# Patient Record
Sex: Male | Born: 1980 | Race: White | Hispanic: No | Smoking: Former smoker
Health system: Southern US, Community
[De-identification: ages and names within clinical notes are randomized; demographics above are authoritative.]

## PROBLEM LIST (undated history)

## (undated) DIAGNOSIS — Z789 Other specified health status: Secondary | ICD-10-CM

## (undated) HISTORY — PX: KNEE SURGERY: SHX244

## (undated) HISTORY — PX: SHOULDER SURGERY: SHX246

---

## 2016-09-30 ENCOUNTER — Ambulatory Visit
Admission: EM | Admit: 2016-09-30 | Discharge: 2016-09-30 | Disposition: A | Discharge status: Home / Self Care | Payer: BLUE CROSS/BLUE SHIELD | Attending: Emergency Medicine | Admitting: Emergency Medicine

## 2016-09-30 ENCOUNTER — Encounter: Payer: Self-pay | Admitting: Emergency Medicine

## 2016-09-30 DIAGNOSIS — J020 Streptococcal pharyngitis: Secondary | ICD-10-CM

## 2016-09-30 LAB — RAPID STREP SCREEN (MED CTR MEBANE ONLY): STREPTOCOCCUS, GROUP A SCREEN (DIRECT): POSITIVE — AB

## 2016-09-30 MED ORDER — DEXAMETHASONE SODIUM PHOSPHATE 10 MG/ML IJ SOLN
10.0000 mg | Freq: Once | INTRAMUSCULAR | Status: AC
  Administered 2016-09-30: 10 mg via INTRAMUSCULAR

## 2016-09-30 MED ORDER — IBUPROFEN 600 MG PO TABS: 600.0000 mg | ORAL_TABLET | Freq: Four times a day (QID) | ORAL | 0 refills | Status: DC | PRN

## 2016-09-30 MED ORDER — PENICILLIN G BENZATHINE 1200000 UNIT/2ML IM SUSP
1.2000 10*6.[IU] | Freq: Once | INTRAMUSCULAR | Status: AC
  Administered 2016-09-30: 1.2 10*6.[IU] via INTRAMUSCULAR

## 2016-09-30 MED ORDER — ACETAMINOPHEN 500 MG PO TABS
1000.0000 mg | ORAL_TABLET | Freq: Once | ORAL | Status: AC
  Administered 2016-09-30: 1000 mg via ORAL

## 2016-09-30 MED ORDER — IBUPROFEN 800 MG PO TABS
800.0000 mg | ORAL_TABLET | Freq: Once | ORAL | Status: AC
  Administered 2016-09-30: 800 mg via ORAL

## 2016-09-30 NOTE — ED Notes (Signed)
Patient waited 15 minutes and no reactions were noted. Injection site is unremarkable. 

## 2016-09-30 NOTE — Discharge Instructions (Signed)
1 gram of Tylenol and 600 mg ibuprofen together 3-4 times a day as needed for pain.  Make sure you drink plenty of extra fluids.  Some people find salt water gargles and  Traditional Medicinal's "Throat Coat" tea helpful. Take 5 mL of liquid Benadryl and 5 mL of Maalox. Mix it together, and then hold it in your mouth for as long as you can and then swallow. You may do this 4 times a day.  O2 the ER for the signs and symptoms we discussed otherwise follow up with one of the primary care physicians below.  Here is a list of primary care providers who are taking new patients:  Dr. Elizabeth Sauereanna Jones [address] [place] [address] [phone]  Dr. Everlene OtherJayce Cook [address]  [address]  [place] [id number]  [phone]  [facility] [place] [address]  [place] [address]  [phone]   Go to [email or web address].com to look up your medications. This will give you a list of where you can find your prescriptions at the most affordable prices.

## 2016-09-30 NOTE — ED Provider Notes (Signed)
HPI  SUBJECTIVE:  Patient reports sore throat starting yesterday. pain is worse on the left side.. Sx worse with swallowing.  Sx better with nothing. Has tried salt water gargles, a shot of tequila and Mucinex.  No fever + Swollen neck glands    No Cough/URI sxs + Myalgias No Headache No Rash     No Recent Strep or mono Exposure No Abdominal Pain No reflux sxs No Allergy sxs  No Breathing difficulty, but states it is difficult to swallow because it "comes back up". He states that it is difficult to talk but has no muffled, hot potato voice. No Drooling No Trismus No antipyretic in past 4-6 hrs. Pt is a smoker. Past medical history negative for diabetes, hypertension, mono, strep PMD: None   History reviewed. No pertinent past medical history.  Past Surgical History:  Procedure Laterality Date  . KNEE SURGERY Bilateral   . SHOULDER SURGERY Left     History reviewed. No pertinent family history.  Social History  Substance Use Topics  . Smoking status: Current Every Day Smoker    Packs/day: 1.00    Types: Cigarettes  . Smokeless tobacco: Never Used  . Alcohol use Yes    No current facility-administered medications for this encounter.   Current Outpatient Prescriptions:  .  ibuprofen (ADVIL,MOTRIN) 600 MG tablet, Take 1 tablet (600 mg total) by mouth every 6 (six) hours as needed., Disp: 30 tablet, Rfl: 0  No Known Allergies   ROS  As noted in HPI.   Physical Exam  BP (!) 143/77 (BP Location: Right Arm)   Pulse 98   Temp (!) 100.5 F (38.1 C) (Oral)   Resp 16   Ht 5\' 10"  (1.778 m)   Wt 236 lb (107 kg)   SpO2 97%   BMI 33.86 kg/m   Constitutional: Well developed, well nourished, no acute distress Eyes:  EOMI, conjunctiva normal bilaterally HENT: Normocephalic, atraumatic,mucus membranes moist. - nasal congestion + erythematous oropharynx + enlarged tonsils +  exudates. Airway patent. Uvula midline, normal size. No drooling, trismus, stridor. No  muffled hot potato voice Respiratory: Normal inspiratory effort Cardiovascular: Normal rate, no murmurs, rubs, gallops GI: nondistended, nontender. No appreciable splenomegaly skin: No rash, skin intact Lymph: + cervical LN  Musculoskeletal: no deformities Neurologic: Alert & oriented x 3, no focal neuro deficits Psychiatric: Speech and behavior appropriate.  ED Course   Medications  dexamethasone (DECADRON) injection 10 mg (10 mg Intramuscular Given 09/30/16 1223)  ibuprofen (ADVIL,MOTRIN) tablet 800 mg (800 mg Oral Given 09/30/16 1226)  acetaminophen (TYLENOL) tablet 1,000 mg (1,000 mg Oral Given 09/30/16 1226)  penicillin g benzathine (BICILLIN LA) 1200000 UNIT/2ML injection 1.2 Million Units (1.2 Million Units Intramuscular Given 09/30/16 1224)    Orders Placed This Encounter  Procedures  . Rapid strep screen    Standing Status:   Standing    Number of Occurrences:   1    Results for orders placed or performed during the hospital encounter of 09/30/16 (from the past 24 hour(s))  Rapid strep screen     Status: Abnormal   Collection Time: 09/30/16 11:51 AM  Result Value Ref Range   Streptococcus, Group A Screen (Direct) POSITIVE (A) NEGATIVE   No results found.  ED Clinical Impression  Strep pharyngitis   ED Assessment/Plan  Rapid strep positive. Giving Bicillin 1.2 million units IM, Decadron 10 mg IM, ibuprofen 800 mg with 1 g of Tylenol. No evidence of airway compromise at this time.. Home with ibuprofen, Tylenol. Patient  to followup with PMD of choice when necessary, will refer to local primary care resources.  Discussed labs,  MDM, plan and followup with patient. Discussed sn/sx that should prompt return to the ED. Patient agrees with plan.   Meds ordered this encounter  Medications  . dexamethasone (DECADRON) injection 10 mg  . ibuprofen (ADVIL,MOTRIN) tablet 800 mg  . acetaminophen (TYLENOL) tablet 1,000 mg  . penicillin g benzathine (BICILLIN LA) 1200000  UNIT/2ML injection 1.2 Million Units    Order Specific Question:   Antibiotic Indication:    Answer:   Pharyngitis  . ibuprofen (ADVIL,MOTRIN) 600 MG tablet    Sig: Take 1 tablet (600 mg total) by mouth every 6 (six) hours as needed.    Dispense:  30 tablet    Refill:  0     *This clinic note was created using Scientist, clinical (histocompatibility and immunogenetics). Therefore, there may be occasional mistakes despite careful proofreading.    Domenick Gong, MD 09/30/16 1246

## 2016-09-30 NOTE — ED Triage Notes (Signed)
Patient c/o sore throat that started yesterday.  Patient denies fevers.  

## 2016-10-03 ENCOUNTER — Telehealth: Payer: Self-pay

## 2017-03-05 ENCOUNTER — Encounter: Payer: Self-pay | Admitting: *Deleted

## 2017-03-05 ENCOUNTER — Ambulatory Visit
Admission: EM | Admit: 2017-03-05 | Discharge: 2017-03-05 | Disposition: A | Discharge status: Home / Self Care | Payer: BLUE CROSS/BLUE SHIELD | Attending: Family Medicine | Admitting: Family Medicine

## 2017-03-05 DIAGNOSIS — S46911A Strain of unspecified muscle, fascia and tendon at shoulder and upper arm level, right arm, initial encounter: Secondary | ICD-10-CM

## 2017-03-05 MED ORDER — NAPROXEN 500 MG PO TABS: 500.0000 mg | ORAL_TABLET | Freq: Two times a day (BID) | ORAL | 0 refills | Status: DC

## 2017-03-05 NOTE — ED Provider Notes (Signed)
[facility]    CSN: [id number] Arrival date & time: 03/05/17  1946     History   Chief Complaint Chief Complaint  Patient presents with  . Shoulder Pain    HPI Mike Hansen is a 36 y.o. male.   HPI  Is a 36 year old male who presents with right shoulder pain. States he coworker were lifting a generator yesterday when he felt a pull and a pop in his shoulder. As the day progresses and into today the shoulder pain is gotten much worse. He states with movement of his arm away from his side the pain is worse and he also hears 3 distinct pops in the shoulder. He indicates that the shoulder pain is so localized over the acromioclavicular joint. He also had one episode where her pain would run down into his ring and little fingers. His neck is not involved.         History reviewed. No pertinent past medical history.  There are no active problems to display for this patient.   Past Surgical History:  Procedure Laterality Date  . KNEE SURGERY Bilateral   . SHOULDER SURGERY Left        Home Medications    Prior to Admission medications   Medication Sig Start Date End Date Taking? Authorizing Provider  ibuprofen (ADVIL,MOTRIN) 600 MG tablet Take 1 tablet (600 mg total) by mouth every 6 (six) hours as needed. 09/30/16  Yes Domenick Gong, MD  naproxen (NAPROSYN) 500 MG tablet Take 1 tablet (500 mg total) by mouth 2 (two) times daily with a meal. 03/05/17   Lutricia Feil, PA-C    Family History History reviewed. No pertinent family history.  Social History Social History  Substance Use Topics  . Smoking status: Current Every Day Smoker    Packs/day: 1.00    Types: Cigarettes  . Smokeless tobacco: Never Used  . Alcohol use Yes     Allergies   Patient has no known allergies.   Review of Systems Review of Systems  Constitutional: Positive for activity change. Negative for appetite change, chills, fatigue and fever.  Musculoskeletal:  Positive for arthralgias.  All other systems reviewed and are negative.    Physical Exam Triage Vital Signs ED Triage Vitals  Enc Vitals Group     BP 03/05/17 1956 (!) 150/88     Pulse Rate 03/05/17 1956 79     Resp 03/05/17 1956 16     Temp 03/05/17 1956 98.1 F (36.7 C)     Temp Source 03/05/17 1956 Oral     SpO2 03/05/17 1956 99 %     Weight 03/05/17 1953 239 lb (108.4 kg)     Height 03/05/17 1953 5\' 10"  (1.778 m)     Head Circumference --      Peak Flow --      Pain Score 03/05/17 1954 8     Pain Loc --      Pain Edu? --      Excl. in GC? --    No data found.   Updated Vital Signs BP (!) 150/88 (BP Location: Left Arm)   Pulse 79   Temp 98.1 F (36.7 C) (Oral)   Resp 16   Ht 5\' 10"  (1.778 m)   Wt 239 lb (108.4 kg)   SpO2 99%   BMI 34.29 kg/m   Visual Acuity Right Eye Distance:   Left Eye Distance:   Bilateral Distance:    Right Eye Near:   Left  Eye Near:    Bilateral Near:     Physical Exam  Constitutional: He is oriented to person, place, and time. He appears well-developed and well-nourished. No distress.  HENT:  Head: Normocephalic.  Eyes: Pupils are equal, round, and reactive to light. Right eye exhibits no discharge. Left eye exhibits no discharge.  Neck: Normal range of motion. Neck supple.  Musculoskeletal: He exhibits tenderness.  Examination of the right shoulder shows no deformity. Tenderness is Sharply localized over the acromioclavicular joint which causes him his discomfort. Passively the patient has good range of motion of his shoulder. He did have one small pop with initial abduction. This could not be reproduced a second time. Upper extremity sensation is intact to light touch. Upper extremity strength is intact. Range of motion is full and comfortable  Neurological: He is alert and oriented to person, place, and time.  Skin: Skin is warm and dry. He is not diaphoretic.  Psychiatric: He has a normal mood and affect. His behavior is normal.  Judgment and thought content normal.  Nursing note and vitals reviewed.    UC Treatments / Results  Labs (all labs ordered are listed, but only abnormal results are displayed) Labs Reviewed - No data to display  EKG  EKG Interpretation None       Radiology No results found.  Procedures Procedures (including critical care time)  Medications Ordered in UC Medications - No data to display   Initial Impression / Assessment and Plan / UC Course  I have reviewed the triage vital signs and the nursing notes.  Pertinent labs & imaging results that were available during my care of the patient were reviewed by me and considered in my medical decision making (see chart for details).     Plan: 1. Test/x-ray results and diagnosis reviewed with patient 2. rx as per orders; risks, benefits, potential side effects reviewed with patient 3. Recommend supportive treatment with symptom avoidance and rest. Recommend anti-inflammatory medications and ice 20 minutes out of every 2 hours 4-5 times daily. Biofreeze 3 times daily may help in lessening his pain. Is not improving he should follow-up with [facility] when he has established relationship  4. F/u prn if symptoms worsen or don't improve   Final Clinical Impressions(s) / UC Diagnoses   Final diagnoses:  Strain of AC joint, right, initial encounter    New Prescriptions Discharge Medication List as of 03/05/2017  8:16 PM    START taking these medications   Details  naproxen (NAPROSYN) 500 MG tablet Take 1 tablet (500 mg total) by mouth 2 (two) times daily with a meal., Starting Thu 03/05/2017, Print         Controlled Substance Prescriptions [place] Controlled Substance Registry consulted? Not Applicable   Lutricia Feil, PA-C 03/05/17 2022

## 2017-03-05 NOTE — ED Triage Notes (Signed)
Right arm pain . Pt was lifting generator yesterday and felt like something pulled on the shoulder.

## 2017-11-14 ENCOUNTER — Ambulatory Visit
Admission: EM | Admit: 2017-11-14 | Discharge: 2017-11-14 | Disposition: A | Discharge status: Home / Self Care | Payer: BLUE CROSS/BLUE SHIELD | Attending: Family Medicine | Admitting: Family Medicine

## 2017-11-14 DIAGNOSIS — F17210 Nicotine dependence, cigarettes, uncomplicated: Secondary | ICD-10-CM | POA: Diagnosis not present

## 2017-11-14 DIAGNOSIS — R0789 Other chest pain: Secondary | ICD-10-CM

## 2017-11-14 DIAGNOSIS — R1013 Epigastric pain: Secondary | ICD-10-CM | POA: Diagnosis not present

## 2017-11-14 DIAGNOSIS — R079 Chest pain, unspecified: Secondary | ICD-10-CM | POA: Diagnosis present

## 2017-11-14 MED ORDER — GI COCKTAIL ~~LOC~~
30.0000 mL | Freq: Once | ORAL | Status: AC
  Administered 2017-11-14: 30 mL via ORAL

## 2017-11-14 NOTE — Discharge Instructions (Signed)
Prilosec or Prevacid or Nexium (2 tablets daily) Take tylenol only for pain TUMS

## 2017-11-14 NOTE — ED Provider Notes (Signed)
[facility]    CSN: [id number] Arrival date & time: 11/14/17  1246     History   Chief Complaint Chief Complaint  Patient presents with  . Chest Pain    HPI Mike Hansen is a 37 y.o. male.   The history is provided by the patient.  Chest Pain  Pain location:  Substernal area and epigastric (lower sternum and epigastric) Pain quality: aching   Pain radiates to:  Does not radiate Pain severity:  Moderate Onset quality:  Sudden Duration:  2 days Timing:  Constant Progression:  Waxing and waning Chronicity:  New Context comment:  Has been taking ibuprofen and Goody Powder for the past month for right shoulder pain after rotator cuff surgery Relieved by:  Nothing Ineffective treatments:  Antacids (TUMS) Associated symptoms: abdominal pain and heartburn   Associated symptoms: no AICD problem, no altered mental status, no anorexia, no anxiety, no back pain, no claudication, no cough, no diaphoresis, no dizziness, no dysphagia, no fatigue, no fever, no headache, no lower extremity edema, no nausea, no near-syncope, no numbness, no orthopnea, no palpitations, no PND, no shortness of breath, no syncope, no vomiting and no weakness   Risk factors: smoking   Risk factors: no aortic disease, no birth control, no coronary artery disease, no diabetes mellitus, no Ehlers-Danlos syndrome, no high cholesterol, no hypertension, no immobilization, not male, no Marfan's syndrome, not obese, not pregnant, no prior DVT/PE and no surgery     History reviewed. No pertinent past medical history.  There are no active problems to display for this patient.   Past Surgical History:  Procedure Laterality Date  . KNEE SURGERY Bilateral   . SHOULDER SURGERY Left        Home Medications    Prior to Admission medications   Medication Sig Start Date End Date Taking? Authorizing Provider  ibuprofen (ADVIL,MOTRIN) 600 MG tablet Take 1 tablet (600 mg total) by mouth every 6 (six)  hours as needed. 09/30/16   Domenick Gong, MD  naproxen (NAPROSYN) 500 MG tablet Take 1 tablet (500 mg total) by mouth 2 (two) times daily with a meal. 03/05/17   Lutricia Feil, PA-C    Family History No family history on file.  Social History Social History   Tobacco Use  . Smoking status: Current Every Day Smoker    Packs/day: 1.00    Types: Cigarettes  . Smokeless tobacco: Never Used  Substance Use Topics  . Alcohol use: Yes  . Drug use: Not on file     Allergies   Patient has no known allergies.   Review of Systems Review of Systems  Constitutional: Negative for diaphoresis, fatigue and fever.  HENT: Negative for trouble swallowing.   Respiratory: Negative for cough and shortness of breath.   Cardiovascular: Positive for chest pain. Negative for palpitations, orthopnea, claudication, syncope, PND and near-syncope.  Gastrointestinal: Positive for abdominal pain and heartburn. Negative for anorexia, nausea and vomiting.  Musculoskeletal: Negative for back pain.  Neurological: Negative for dizziness, weakness, numbness and headaches.     Physical Exam Triage Vital Signs ED Triage Vitals  Enc Vitals Group     BP 11/14/17 1251 (!) 171/102     Pulse Rate 11/14/17 1251 79     Resp 11/14/17 1251 18     Temp 11/14/17 1251 98.2 F (36.8 C)     Temp Source 11/14/17 1251 Oral     SpO2 11/14/17 1251 95 %     Weight --  Height --      Head Circumference --      Peak Flow --      Pain Score 11/14/17 1254 0     Pain Loc --      Pain Edu? --      Excl. in GC? --    No data found.  Updated Vital Signs BP (!) 150/92   Pulse 79   Temp 98.2 F (36.8 C) (Oral)   Resp 18   SpO2 95%   Visual Acuity Right Eye Distance:   Left Eye Distance:   Bilateral Distance:    Right Eye Near:   Left Eye Near:    Bilateral Near:     Physical Exam  Constitutional: He is oriented to person, place, and time. He appears well-developed and well-nourished. No distress.    HENT:  Head: Normocephalic and atraumatic.  Cardiovascular: Normal rate, regular rhythm, normal heart sounds and intact distal pulses.  No murmur heard. Pulmonary/Chest: Effort normal and breath sounds normal. No stridor. No respiratory distress. He has no wheezes. He has no rales. He exhibits tenderness (lower sternum).  Abdominal: Soft. Bowel sounds are normal. He exhibits no distension and no mass. There is tenderness (epigastric). There is no rebound and no guarding.  Neurological: He is alert and oriented to person, place, and time.  Skin: No rash noted. He is not diaphoretic.  Nursing note and vitals reviewed.    UC Treatments / Results  Labs (all labs ordered are listed, but only abnormal results are displayed) Labs Reviewed - No data to display  EKG None  Radiology No results found.  Procedures ED EKG Date/Time: 11/14/2017 4:10 PM Performed by: Payton Mccallum, MD Authorized by: Payton Mccallum, MD   ECG reviewed by ED Physician in the absence of a cardiologist: yes   Previous ECG:    Previous ECG:  Unavailable Interpretation:    Interpretation: normal   Rate:    ECG rate:  77   ECG rate assessment: normal   Rhythm:    Rhythm: sinus rhythm   Ectopy:    Ectopy: none   QRS:    QRS axis:  Normal Conduction:    Conduction: normal   ST segments:    ST segments:  Normal T waves:    T waves: normal     (including critical care time)  Medications Ordered in UC Medications  gi cocktail (Maalox,Lidocaine,Donnatal) (30 mLs Oral Given 11/14/17 1353)    Initial Impression / Assessment and Plan / UC Course  I have reviewed the triage vital signs and the nursing notes.  Pertinent labs & imaging results that were available during my care of the patient were reviewed by me and considered in my medical decision making (see chart for details).      Final Clinical Impressions(s) / UC Diagnoses   Final diagnoses:  Abdominal pain, epigastric  Atypical chest pain      Discharge Instructions     Prilosec or Prevacid or Nexium (2 tablets daily) Take tylenol only for pain TUMS    ED Prescriptions    None     1. ekg results and diagnosis reviewed with patient; given GI cocktail x 1 with improvement of symptoms 2. Stop Goodys Powder and ibuprofen 3. Recommend supportive treatment with otc PPI, TUMS, tylenol prn 4. Follow-up prn if symptoms worsen or don't improve  Controlled Substance Prescriptions [place] Controlled Substance Registry consulted? Not Applicable   Payton Mccallum, MD 11/14/17 [phone]

## 2017-11-14 NOTE — ED Triage Notes (Signed)
Pt here for chest pain for the past 2 days, states it's hurting in the middle and radiating towards the right and left side. Reports it's never hurt before and has been treating it like heartburn. Bp is elevated today. Reports shoulder surgery on the right shoulder almost a month ago. Did take tums and shot of vinegar without relief. Did report a lot of burping last night.

## 2018-08-24 ENCOUNTER — Encounter: Payer: Self-pay | Admitting: Emergency Medicine

## 2018-08-24 ENCOUNTER — Other Ambulatory Visit: Payer: Self-pay

## 2018-08-24 ENCOUNTER — Ambulatory Visit
Admission: EM | Admit: 2018-08-24 | Discharge: 2018-08-24 | Disposition: A | Discharge status: Home / Self Care | Payer: BLUE CROSS/BLUE SHIELD | Attending: Family Medicine | Admitting: Family Medicine

## 2018-08-24 DIAGNOSIS — B9789 Other viral agents as the cause of diseases classified elsewhere: Secondary | ICD-10-CM

## 2018-08-24 DIAGNOSIS — R030 Elevated blood-pressure reading, without diagnosis of hypertension: Secondary | ICD-10-CM | POA: Diagnosis not present

## 2018-08-24 DIAGNOSIS — Z720 Tobacco use: Secondary | ICD-10-CM

## 2018-08-24 DIAGNOSIS — J069 Acute upper respiratory infection, unspecified: Secondary | ICD-10-CM | POA: Diagnosis not present

## 2018-08-24 LAB — RAPID STREP SCREEN (MED CTR MEBANE ONLY): Streptococcus, Group A Screen (Direct): NEGATIVE

## 2018-08-24 MED ORDER — PREDNISONE 20 MG PO TABS: 20.0000 mg | ORAL_TABLET | Freq: Every day | ORAL | 0 refills | Status: DC

## 2018-08-24 NOTE — ED Provider Notes (Signed)
[facility]    CSN: [id number] Arrival date & time: 08/24/18  0900     History   Chief Complaint Chief Complaint  Patient presents with  . Sore Throat  . Cough    HPI Mike Hansen is a 38 y.o. male.   The history is provided by the patient.  Sore Throat   Cough  Associated symptoms: rhinorrhea and sore throat   Associated symptoms: no wheezing   URI  Presenting symptoms: congestion, cough, fatigue, rhinorrhea and sore throat   Severity:  Moderate Onset quality:  Sudden Duration:  1 week Timing:  Constant Progression:  Unchanged Chronicity:  New Relieved by:  OTC medications Associated symptoms: no sinus pain and no wheezing   Risk factors: sick contacts     History reviewed. No pertinent past medical history.  There are no active problems to display for this patient.   Past Surgical History:  Procedure Laterality Date  . KNEE SURGERY Bilateral   . SHOULDER SURGERY Left        Home Medications    Prior to Admission medications   Medication Sig Start Date End Date Taking? Authorizing Provider  ibuprofen (ADVIL,MOTRIN) 600 MG tablet Take 1 tablet (600 mg total) by mouth every 6 (six) hours as needed. 09/30/16   Domenick Gong, MD  naproxen (NAPROSYN) 500 MG tablet Take 1 tablet (500 mg total) by mouth 2 (two) times daily with a meal. 03/05/17   Lutricia Feil, PA-C  predniSONE (DELTASONE) 20 MG tablet Take 1 tablet (20 mg total) by mouth daily. 08/24/18   Payton Mccallum, MD    Family History Family History  Problem Relation Age of Onset  . Hypertension Father   . Heart attack Father   . Cancer Father     Social History Social History   Tobacco Use  . Smoking status: Current Every Day Smoker    Packs/day: 1.00    Types: Cigarettes  . Smokeless tobacco: Never Used  Substance Use Topics  . Alcohol use: Yes  . Drug use: Never     Allergies   Patient has no known allergies.   Review of Systems Review of Systems    Constitutional: Positive for fatigue.  HENT: Positive for congestion, rhinorrhea and sore throat. Negative for sinus pain.   Respiratory: Positive for cough. Negative for wheezing.      Physical Exam Triage Vital Signs ED Triage Vitals  Enc Vitals Group     BP 08/24/18 0917 (!) 165/105     Pulse Rate 08/24/18 0917 74     Resp 08/24/18 0917 16     Temp 08/24/18 0917 98.3 F (36.8 C)     Temp Source 08/24/18 0917 Oral     SpO2 08/24/18 0917 99 %     Weight 08/24/18 0914 228 lb (103.4 kg)     Height 08/24/18 0914 5' 10.5" (1.791 m)     Head Circumference --      Peak Flow --      Pain Score 08/24/18 0913 4     Pain Loc --      Pain Edu? --      Excl. in GC? --    No data found.  Updated Vital Signs BP (!) 165/105 (BP Location: Left Arm)   Pulse 74   Temp 98.3 F (36.8 C) (Oral)   Resp 16   Ht 5' 10.5" (1.791 m)   Wt 103.4 kg   SpO2 99%   BMI 32.25 kg/m  Visual Acuity Right Eye Distance:   Left Eye Distance:   Bilateral Distance:    Right Eye Near:   Left Eye Near:    Bilateral Near:     Physical Exam Vitals signs and nursing note reviewed.  Constitutional:      General: He is not in acute distress.    Appearance: He is well-developed. He is not toxic-appearing or diaphoretic.  HENT:     Head: Normocephalic and atraumatic.     Right Ear: Tympanic membrane, ear canal and external ear normal.     Left Ear: Tympanic membrane, ear canal and external ear normal.     Nose: Nose normal.     Mouth/Throat:     Pharynx: Uvula midline. Posterior oropharyngeal erythema present. No oropharyngeal exudate.     Tonsils: No tonsillar abscesses.  Eyes:     General: No scleral icterus.       Right eye: No discharge.        Left eye: No discharge.  Neck:     Musculoskeletal: Normal range of motion and neck supple.     Thyroid: No thyromegaly.     Trachea: No tracheal deviation.  Cardiovascular:     Rate and Rhythm: Normal rate and regular rhythm.     Heart sounds:  Normal heart sounds.  Pulmonary:     Effort: Pulmonary effort is normal. No respiratory distress.     Breath sounds: Normal breath sounds. No stridor. No wheezing, rhonchi or rales.  Chest:     Chest wall: No tenderness.  Lymphadenopathy:     Cervical: No cervical adenopathy.  Skin:    General: Skin is warm and dry.     Findings: No rash.  Neurological:     Mental Status: He is alert.      UC Treatments / Results  Labs (all labs ordered are listed, but only abnormal results are displayed) Labs Reviewed  RAPID STREP SCREEN ([facility] ONLY)  CULTURE, GROUP A STREP [facility])    EKG None  Radiology No results found.  Procedures Procedures (including critical care time)  Medications Ordered in UC Medications - No data to display  Initial Impression / Assessment and Plan / UC Course  I have reviewed the triage vital signs and the nursing notes.  Pertinent labs & imaging results that were available during my care of the patient were reviewed by me and considered in my medical decision making (see chart for details).      Final Clinical Impressions(s) / UC Diagnoses   Final diagnoses:  Viral URI with cough    ED Prescriptions    Medication Sig Dispense Auth. Provider   predniSONE (DELTASONE) 20 MG tablet Take 1 tablet (20 mg total) by mouth daily. 5 tablet Payton Mccallum, MD      1. Lab results and diagnosis reviewed with patient 2. rx as per orders above; reviewed possible side effects, interactions, risks and benefits  3. Recommend supportive treatment with rest, increased fluids, otc meds prn 4. Follow-up prn if symptoms worsen or don't improve  Controlled Substance Prescriptions [place] Controlled Substance Registry consulted? Not Applicable   Payton Mccallum, MD 08/24/18 [phone]

## 2018-08-24 NOTE — Discharge Instructions (Signed)
Over the counter flonase nasal spray sudafed

## 2018-08-24 NOTE — ED Triage Notes (Signed)
Patient c/o sore throat and dry cough that started last week.  Patient denies recent fever.

## 2018-08-27 LAB — CULTURE, GROUP A STREP (THRC)

## 2019-01-12 ENCOUNTER — Ambulatory Visit
Admission: EM | Admit: 2019-01-12 | Discharge: 2019-01-12 | Disposition: A | Discharge status: Home / Self Care | Payer: BC Managed Care – PPO | Attending: Emergency Medicine | Admitting: Emergency Medicine

## 2019-01-12 ENCOUNTER — Other Ambulatory Visit: Payer: Self-pay

## 2019-01-12 ENCOUNTER — Encounter: Payer: Self-pay | Admitting: Emergency Medicine

## 2019-01-12 DIAGNOSIS — R590 Localized enlarged lymph nodes: Secondary | ICD-10-CM

## 2019-01-12 DIAGNOSIS — R609 Edema, unspecified: Secondary | ICD-10-CM

## 2019-01-12 LAB — RAPID STREP SCREEN (MED CTR MEBANE ONLY): Streptococcus, Group A Screen (Direct): NEGATIVE

## 2019-01-12 MED ORDER — PREDNISONE 10 MG PO TABS: ORAL_TABLET | ORAL | 0 refills | Status: DC

## 2019-01-12 MED ORDER — AMOXICILLIN-POT CLAVULANATE 875-125 MG PO TABS: 1.0000 | ORAL_TABLET | Freq: Two times a day (BID) | ORAL | 0 refills | Status: DC

## 2019-01-12 NOTE — ED Triage Notes (Signed)
Patient c/o swollen lymph node for a week.  Patient denies fever or sore throat.

## 2019-01-12 NOTE — Discharge Instructions (Signed)
Take medication as prescribed. Rest. Drink plenty of fluids. Monitor closely.   Follow up with Ear Nose and Throat in one week. See above.   Follow up with your primary care physician this week as needed. Return to Urgent care for new or worsening concerns.

## 2019-01-12 NOTE — ED Provider Notes (Signed)
[facility] ____________________________________________  Time seen: Approximately 6:47 PM  I have reviewed the triage vital signs and the nursing notes.   HISTORY  Chief Complaint Swollen Lymph Node (left side of neck)   HPI Mike Hansen is a 38 y.o. male presenting for evaluation of left side of neck swelling area present for the last 1 week.  Patient states he notices this area swell up more after he eats.  Then goes down over time but never fully resolves.  States the area is tender to touch.  Denies any sore throat or difficulty swallowing.  No recent cough, congestion, sore throat, fevers, chest pain or shortness of breath.  Denies rash.  Denies injury.  Has had this happen previously every so often but only would last a day and not longer.  Denies known sick contacts.  Did take Tylenol which did not change much.  Denies other alleviating measures.  Denies other aggravating factors.  Reports otherwise feels well.  Denies any other swelling or skin changes.  Denies recent atypical tick bite.   History reviewed. No pertinent past medical history. Denies   There are no active problems to display for this patient.   Past Surgical History:  Procedure Laterality Date  . KNEE SURGERY Bilateral   . SHOULDER SURGERY Left      No current facility-administered medications for this encounter.   Current Outpatient Medications:  .  amoxicillin-clavulanate (AUGMENTIN) 875-125 MG tablet, Take 1 tablet by mouth every 12 (twelve) hours., Disp: 20 tablet, Rfl: 0 .  ibuprofen (ADVIL,MOTRIN) 600 MG tablet, Take 1 tablet (600 mg total) by mouth every 6 (six) hours as needed., Disp: 30 tablet, Rfl: 0 .  predniSONE (DELTASONE) 10 MG tablet, Start 60 mg po day one, then 50 mg po day two, taper by 10 mg daily until complete., Disp: 21 tablet, Rfl: 0  Allergies Patient has no known allergies.  Family History  Problem Relation Age of Onset  . Hypertension Father   . Heart  attack Father   . Cancer Father     Social History Social History   Tobacco Use  . Smoking status: Current Every Day Smoker    Packs/day: 1.00    Types: Cigarettes  . Smokeless tobacco: Never Used  Substance Use Topics  . Alcohol use: Yes  . Drug use: Never    Review of Systems Constitutional: No fever ENT: No sore throat. As above.  Cardiovascular: Denies chest pain. Respiratory: Denies shortness of breath. Gastrointestinal: No abdominal pain.   Musculoskeletal: Negative for back pain. Skin: Negative for rash. Neurological: Negative for headaches, focal weakness or numbness.    ____________________________________________   PHYSICAL EXAM:  VITAL SIGNS: ED Triage Vitals  Enc Vitals Group     BP 01/12/19 1752 (!) 148/92     Pulse Rate 01/12/19 1752 75     Resp 01/12/19 1752 16     Temp 01/12/19 1752 98.8 F (37.1 C)     Temp Source 01/12/19 1752 Oral     SpO2 01/12/19 1752 98 %     Weight 01/12/19 1748 230 lb (104.3 kg)     Height 01/12/19 1748 5' 10.5" (1.791 m)     Head Circumference --      Peak Flow --      Pain Score 01/12/19 1748 2     Pain Loc --      Pain Edu? --      Excl. in GC? --     Constitutional: Alert  and oriented. Well appearing and in no acute distress. Eyes: Conjunctivae are normal.  ENT      Head: Normocephalic and atraumatic.      Ears: Nontender, no erythema, normal TM bilaterally.      Nose: No congestion      Mouth/Throat: Mucous membranes are moist.Oropharynx non-erythematous.  No tonsillar swelling or exudate.  No dental tenderness or gumline swelling. Neck: No stridor. Supple without meningismus.  Hematological/Lymphatic/Immunilogical: Left submandibular tenderness and swelling with mild tenderness, no erythema or fluctuance.  No other cervical lymphadenopathy noted.  No supraclavicular lymphadenopathy. Cardiovascular: Normal rate, regular rhythm. Grossly normal heart sounds.  Good peripheral circulation. Respiratory: Normal  respiratory effort without tachypnea nor retractions. Breath sounds are clear and equal bilaterally. No wheezes, rales, rhonchi. Musculoskeletal: Steady gait. Neurologic:  Normal speech and language. Speech is normal. No gait instability.  Skin:  Skin is warm, dry and intact. No rash noted. Psychiatric: Mood and affect are normal. Speech and behavior are normal. Patient exhibits appropriate insight and judgment   ___________________________________________   LABS (all labs ordered are listed, but only abnormal results are displayed)  Labs Reviewed  RAPID STREP SCREEN ([facility] ONLY)  CULTURE, GROUP A STREP [facility])     PROCEDURES Procedures    INITIAL IMPRESSION / ASSESSMENT AND PLAN / ED COURSE  Pertinent labs & imaging results that were available during my care of the patient were reviewed by me and considered in my medical decision making (see chart for details).  Well-appearing patient.  No acute distress.  Strep negative, will culture.  Discussed multiple differentials with patient including bacterial versus viral versus salivary gland and salivary stone.  Will treat with oral Augmentin and prednisone.  Encourage sucking on sour candies to salivate.  Follow-up with ENT in 1 week.  Discussed strict follow-up and return parameters.  Discussed follow up with Primary care physician this week. Discussed follow up and return parameters including no resolution or any worsening concerns. Patient verbalized understanding and agreed to plan.   ____________________________________________   FINAL CLINICAL IMPRESSION(S) / ED DIAGNOSES  Final diagnoses:  Submandibular gland swelling     ED Discharge Orders         Ordered    predniSONE (DELTASONE) 10 MG tablet     01/12/19 1847    amoxicillin-clavulanate (AUGMENTIN) 875-125 MG tablet  Every 12 hours     01/12/19 1847           Note: This dictation was prepared with Dragon dictation along with smaller phrase technology.  Any transcriptional errors that result from this process are unintentional.         Marylene Land, NP 01/12/19 1955

## 2019-01-15 LAB — CULTURE, GROUP A STREP (THRC)

## 2019-01-28 ENCOUNTER — Other Ambulatory Visit: Payer: Self-pay | Admitting: Otolaryngology

## 2019-01-28 DIAGNOSIS — K115 Sialolithiasis: Secondary | ICD-10-CM

## 2019-02-03 ENCOUNTER — Ambulatory Visit: Admission: RE | Admit: 2019-02-03 | Payer: BC Managed Care – PPO | Source: Ambulatory Visit

## 2019-02-04 ENCOUNTER — Other Ambulatory Visit: Payer: Self-pay

## 2019-02-04 ENCOUNTER — Ambulatory Visit
Admission: RE | Admit: 2019-02-04 | Discharge: 2019-02-04 | Disposition: A | Discharge status: Home / Self Care | Payer: BC Managed Care – PPO | Source: Ambulatory Visit | Attending: Otolaryngology | Admitting: Otolaryngology

## 2019-02-04 DIAGNOSIS — K115 Sialolithiasis: Secondary | ICD-10-CM | POA: Insufficient documentation

## 2019-02-04 MED ORDER — IOHEXOL 300 MG/ML  SOLN
75.0000 mL | Freq: Once | INTRAMUSCULAR | Status: AC | PRN
  Administered 2019-02-04: 14:00:00 75 mL via INTRAVENOUS

## 2019-03-07 ENCOUNTER — Other Ambulatory Visit: Payer: Self-pay

## 2019-03-07 ENCOUNTER — Encounter
Admission: RE | Admit: 2019-03-07 | Discharge: 2019-03-07 | Disposition: A | Discharge status: Home / Self Care | Payer: BC Managed Care – PPO | Source: Ambulatory Visit | Attending: Otolaryngology | Admitting: Otolaryngology

## 2019-03-07 HISTORY — DX: Other specified health status: Z78.9

## 2019-03-07 NOTE — Patient Instructions (Signed)
Your procedure is scheduled on: 03/15/19 Report to Day Surgery. [facility] To find out your arrival time please call [phone] between 1PM - 3PM on 03/14/19.  Remember: Instructions that are not followed completely may result in serious medical risk,  up to and including death, or upon the discretion of your surgeon and anesthesiologist your  surgery may need to be rescheduled.     _X__ 1. Do not eat food after midnight the night before your procedure.                 No gum chewing or hard candies. You may drink clear liquids up to 2 hours                 before you are scheduled to arrive for your surgery- DO not drink clear                 liquids within 2 hours of the start of your surgery.                 Clear Liquids include:  water, apple juice without pulp, clear carbohydrate                 drink such as Clearfast of Gatorade, Black Coffee or Tea (Do not add                 anything to coffee or tea).  __X__2.  On the morning of surgery brush your teeth with toothpaste and water, you                may rinse your mouth with mouthwash if you wish.  Do not swallow any toothpaste of mouthwash.     _X__ 3.  No Alcohol for 24 hours before or after surgery.   _X__ 4.  Do Not Smoke or use e-cigarettes For 24 Hours Prior to Your Surgery.                 Do not use any chewable tobacco products for at least 6 hours prior to                 surgery.  ____  5.  Bring all medications with you on the day of surgery if instructed.   __X__  6.  Notify your doctor if there is any change in your medical condition      (cold, fever, infections).     Do not wear jewelry, make-up, hairpins, clips or nail polish. Do not wear lotions, powders, or perfumes. You may wear deodorant. Do not shave 48 hours prior to surgery. Men may shave face and neck. Do not bring valuables to the hospital.    [facility] is not responsible for any belongings or  valuables.  Contacts, dentures or bridgework may not be worn into surgery. Leave your suitcase in the car. After surgery it may be brought to your room. For patients admitted to the hospital, discharge time is determined by your treatment team.   Patients discharged the day of surgery will not be allowed to drive home.   Please read over the following fact sheets that you were given:   Surgical Site Infection Prevention          ____ Take these medicines the morning of surgery with A SIP OF WATER:    1. NONE  2.   3.   4.  5.  6.  ____ Fleet Enema (as directed)  _X___ Use CHG Soap as directed  ____ Use inhalers on the day of surgery  ____ Stop metformin 2 days prior to surgery    ____ Take 1/2 of usual insulin dose the night before surgery. No insulin the morning          of surgery.   ____ Stop Coumadin/Plavix/aspirin on   ____ Stop Anti-inflammatories on    ____ Stop supplements until after surgery.    ____ Bring C-Pap to the hospital.

## 2019-03-11 ENCOUNTER — Other Ambulatory Visit: Payer: Self-pay

## 2019-03-11 ENCOUNTER — Other Ambulatory Visit
Admission: RE | Admit: 2019-03-11 | Discharge: 2019-03-11 | Disposition: A | Discharge status: Home / Self Care | Payer: BC Managed Care – PPO | Source: Ambulatory Visit | Attending: Otolaryngology | Admitting: Otolaryngology

## 2019-03-11 DIAGNOSIS — Z01812 Encounter for preprocedural laboratory examination: Secondary | ICD-10-CM | POA: Insufficient documentation

## 2019-03-11 DIAGNOSIS — Z20828 Contact with and (suspected) exposure to other viral communicable diseases: Secondary | ICD-10-CM | POA: Diagnosis not present

## 2019-03-11 LAB — SARS CORONAVIRUS 2 (TAT 6-24 HRS): SARS Coronavirus 2: NEGATIVE

## 2019-03-15 ENCOUNTER — Ambulatory Visit: Payer: BC Managed Care – PPO | Admitting: Anesthesiology

## 2019-03-15 ENCOUNTER — Encounter
Admission: RE | Disposition: A | Discharge status: Home / Self Care | Payer: Self-pay | Source: Home / Self Care | Attending: Otolaryngology

## 2019-03-15 ENCOUNTER — Other Ambulatory Visit: Payer: Self-pay

## 2019-03-15 ENCOUNTER — Observation Stay
Admission: RE | Admit: 2019-03-15 | Discharge: 2019-03-15 | Disposition: A | Discharge status: Home / Self Care | Payer: BC Managed Care – PPO | Attending: Otolaryngology | Admitting: Otolaryngology

## 2019-03-15 DIAGNOSIS — Z8249 Family history of ischemic heart disease and other diseases of the circulatory system: Secondary | ICD-10-CM | POA: Diagnosis not present

## 2019-03-15 DIAGNOSIS — K1120 Sialoadenitis, unspecified: Secondary | ICD-10-CM | POA: Diagnosis present

## 2019-03-15 DIAGNOSIS — Z833 Family history of diabetes mellitus: Secondary | ICD-10-CM | POA: Diagnosis not present

## 2019-03-15 DIAGNOSIS — F17200 Nicotine dependence, unspecified, uncomplicated: Secondary | ICD-10-CM | POA: Insufficient documentation

## 2019-03-15 DIAGNOSIS — Z9089 Acquired absence of other organs: Secondary | ICD-10-CM

## 2019-03-15 DIAGNOSIS — Z809 Family history of malignant neoplasm, unspecified: Secondary | ICD-10-CM | POA: Insufficient documentation

## 2019-03-15 DIAGNOSIS — K115 Sialolithiasis: Secondary | ICD-10-CM | POA: Insufficient documentation

## 2019-03-15 DIAGNOSIS — K1123 Chronic sialoadenitis: Principal | ICD-10-CM | POA: Insufficient documentation

## 2019-03-15 DIAGNOSIS — Z9889 Other specified postprocedural states: Secondary | ICD-10-CM

## 2019-03-15 HISTORY — PX: SUBMANDIBULAR GLAND EXCISION: SHX2456

## 2019-03-15 SURGERY — EXCISION, SUBMANDIBULAR GLAND
Anesthesia: General | Site: Neck | Laterality: Left

## 2019-03-15 MED ORDER — ACETAMINOPHEN-CODEINE #3 300-30 MG PO TABS
ORAL_TABLET | ORAL | Status: AC
  Filled 2019-03-15: qty 1

## 2019-03-15 MED ORDER — LIDOCAINE HCL (PF) 2 % IJ SOLN
INTRAMUSCULAR | Status: AC
  Filled 2019-03-15: qty 10

## 2019-03-15 MED ORDER — FAMOTIDINE 20 MG PO TABS
20.0000 mg | ORAL_TABLET | Freq: Once | ORAL | Status: AC
  Administered 2019-03-15: 06:00:00 20 mg via ORAL

## 2019-03-15 MED ORDER — BACITRACIN ZINC 500 UNIT/GM EX OINT
TOPICAL_OINTMENT | CUTANEOUS | Status: AC
  Filled 2019-03-15: qty 28.35

## 2019-03-15 MED ORDER — EPHEDRINE SULFATE 50 MG/ML IJ SOLN
INTRAMUSCULAR | Status: DC | PRN
  Administered 2019-03-15: 10 mg via INTRAVENOUS
  Administered 2019-03-15 (×2): 15 mg via INTRAVENOUS
  Administered 2019-03-15: 10 mg via INTRAVENOUS

## 2019-03-15 MED ORDER — FENTANYL CITRATE (PF) 100 MCG/2ML IJ SOLN: 25.0000 ug | INTRAMUSCULAR | Status: DC | PRN

## 2019-03-15 MED ORDER — ONDANSETRON HCL 4 MG/2ML IJ SOLN
INTRAMUSCULAR | Status: DC | PRN
  Administered 2019-03-15: 4 mg via INTRAVENOUS

## 2019-03-15 MED ORDER — FENTANYL CITRATE (PF) 100 MCG/2ML IJ SOLN
INTRAMUSCULAR | Status: DC | PRN
  Administered 2019-03-15: 50 ug via INTRAVENOUS
  Administered 2019-03-15: 100 ug via INTRAVENOUS

## 2019-03-15 MED ORDER — FAMOTIDINE 20 MG PO TABS
ORAL_TABLET | ORAL | Status: AC
Start: 2019-03-15 — End: 2019-03-15
  Administered 2019-03-15: 20 mg via ORAL
  Filled 2019-03-15: qty 1

## 2019-03-15 MED ORDER — PROPOFOL 10 MG/ML IV BOLUS
INTRAVENOUS | Status: DC | PRN
  Administered 2019-03-15: 200 mg via INTRAVENOUS

## 2019-03-15 MED ORDER — LIDOCAINE-EPINEPHRINE 1 %-1:100000 IJ SOLN
INTRAMUSCULAR | Status: AC
  Filled 2019-03-15: qty 1

## 2019-03-15 MED ORDER — MIDAZOLAM HCL 2 MG/2ML IJ SOLN
INTRAMUSCULAR | Status: AC
  Filled 2019-03-15: qty 2

## 2019-03-15 MED ORDER — ONDANSETRON HCL 4 MG/2ML IJ SOLN
INTRAMUSCULAR | Status: AC
  Filled 2019-03-15: qty 2

## 2019-03-15 MED ORDER — FENTANYL CITRATE (PF) 100 MCG/2ML IJ SOLN
INTRAMUSCULAR | Status: AC
  Filled 2019-03-15: qty 2

## 2019-03-15 MED ORDER — ONDANSETRON HCL 4 MG/2ML IJ SOLN: 4.0000 mg | Freq: Once | INTRAMUSCULAR | Status: DC | PRN

## 2019-03-15 MED ORDER — SUCCINYLCHOLINE CHLORIDE 20 MG/ML IJ SOLN
INTRAMUSCULAR | Status: DC | PRN
  Administered 2019-03-15: 160 mg via INTRAVENOUS

## 2019-03-15 MED ORDER — DEXAMETHASONE SODIUM PHOSPHATE 10 MG/ML IJ SOLN
INTRAMUSCULAR | Status: AC
  Filled 2019-03-15: qty 1

## 2019-03-15 MED ORDER — ROCURONIUM BROMIDE 50 MG/5ML IV SOLN
INTRAVENOUS | Status: AC
  Filled 2019-03-15: qty 1

## 2019-03-15 MED ORDER — ONDANSETRON HCL 4 MG PO TABS: 4.0000 mg | ORAL_TABLET | Freq: Three times a day (TID) | ORAL | 0 refills | Status: DC | PRN

## 2019-03-15 MED ORDER — ROCURONIUM BROMIDE 100 MG/10ML IV SOLN
INTRAVENOUS | Status: DC | PRN
  Administered 2019-03-15: 5 mg via INTRAVENOUS

## 2019-03-15 MED ORDER — LIDOCAINE HCL (CARDIAC) PF 100 MG/5ML IV SOSY
PREFILLED_SYRINGE | INTRAVENOUS | Status: DC | PRN
  Administered 2019-03-15: 80 mg via INTRAVENOUS

## 2019-03-15 MED ORDER — MIDAZOLAM HCL 2 MG/2ML IJ SOLN
INTRAMUSCULAR | Status: DC | PRN
  Administered 2019-03-15: 2 mg via INTRAVENOUS

## 2019-03-15 MED ORDER — ACETAMINOPHEN-CODEINE #3 300-30 MG PO TABS: 1.0000 | ORAL_TABLET | ORAL | 0 refills | Status: DC | PRN

## 2019-03-15 MED ORDER — LACTATED RINGERS IV SOLN
INTRAVENOUS | Status: DC
  Administered 2019-03-15 (×2): via INTRAVENOUS

## 2019-03-15 MED ORDER — SUCCINYLCHOLINE CHLORIDE 20 MG/ML IJ SOLN
INTRAMUSCULAR | Status: AC
  Filled 2019-03-15: qty 1

## 2019-03-15 MED ORDER — LIDOCAINE-EPINEPHRINE 1 %-1:100000 IJ SOLN
INTRAMUSCULAR | Status: DC | PRN
  Administered 2019-03-15: 7 mL

## 2019-03-15 MED ORDER — PROPOFOL 10 MG/ML IV BOLUS
INTRAVENOUS | Status: AC
  Filled 2019-03-15: qty 20

## 2019-03-15 MED ORDER — DEXAMETHASONE SODIUM PHOSPHATE 10 MG/ML IJ SOLN
INTRAMUSCULAR | Status: DC | PRN
  Administered 2019-03-15: 10 mg via INTRAVENOUS

## 2019-03-15 MED ORDER — ACETAMINOPHEN-CODEINE #3 300-30 MG PO TABS
ORAL_TABLET | ORAL | Status: AC
  Administered 2019-03-15: 2 via ORAL
  Filled 2019-03-15: qty 1

## 2019-03-15 MED ORDER — EPHEDRINE SULFATE 50 MG/ML IJ SOLN
INTRAMUSCULAR | Status: AC
  Filled 2019-03-15: qty 1

## 2019-03-15 MED ORDER — ACETAMINOPHEN-CODEINE #3 300-30 MG PO TABS
1.0000 | ORAL_TABLET | ORAL | Status: DC | PRN
  Administered 2019-03-15: 10:00:00 2 via ORAL
  Filled 2019-03-15: qty 2

## 2019-03-15 SURGICAL SUPPLY — 43 items
BLADE SURG 15 STRL LF DISP TIS (BLADE) ×1 IMPLANT
BLADE SURG 15 STRL SS (BLADE) ×1
CANISTER SUCT 1200ML W/VALVE (MISCELLANEOUS) ×2 IMPLANT
CNTNR SPEC 2.5X3XGRAD LEK (MISCELLANEOUS)
CONT SPEC 4OZ STER OR WHT (MISCELLANEOUS)
CONTAINER SPEC 2.5X3XGRAD LEK (MISCELLANEOUS) IMPLANT
CORD BIP STRL DISP 12FT (MISCELLANEOUS) ×2 IMPLANT
COVER WAND RF STERILE (DRAPES) ×2 IMPLANT
DERMABOND ADVANCED (GAUZE/BANDAGES/DRESSINGS) ×1
DERMABOND ADVANCED .7 DNX12 (GAUZE/BANDAGES/DRESSINGS) ×1 IMPLANT
DRAIN TLS ROUND 10FR (DRAIN) IMPLANT
DRAPE MAG INST 16X20 L/F (DRAPES) ×2 IMPLANT
DRSG TEGADERM 2-3/8X2-3/4 SM (GAUZE/BANDAGES/DRESSINGS) IMPLANT
ELECT NEEDLE 20X.3 GREEN (MISCELLANEOUS)
ELECT REM PT RETURN 9FT ADLT (ELECTROSURGICAL) ×2
ELECTRODE NEEDLE 20X.3 GREEN (MISCELLANEOUS) IMPLANT
ELECTRODE REM PT RTRN 9FT ADLT (ELECTROSURGICAL) ×1 IMPLANT
FORCEPS JEWEL BIP 4-3/4 STR (INSTRUMENTS) ×2 IMPLANT
GAUZE 4X4 16PLY RFD (DISPOSABLE) IMPLANT
GLOVE BIO SURGEON STRL SZ7.5 (GLOVE) ×4 IMPLANT
GOWN STRL REUS W/ TWL LRG LVL3 (GOWN DISPOSABLE) ×3 IMPLANT
GOWN STRL REUS W/TWL LRG LVL3 (GOWN DISPOSABLE) ×3
HEMOSTAT SURGICEL 2X3 (HEMOSTASIS) ×2 IMPLANT
HOOK STAY BLUNT/RETRACTOR 5M (MISCELLANEOUS) IMPLANT
LABEL OR SOLS (LABEL) ×2 IMPLANT
NS IRRIG 500ML POUR BTL (IV SOLUTION) ×2 IMPLANT
PACK HEAD/NECK (MISCELLANEOUS) ×2 IMPLANT
PROBE MONO 100X0.75 ELECT 1.9M (MISCELLANEOUS) IMPLANT
SHEARS HARMONIC 9CM CVD (BLADE) ×2 IMPLANT
SPONGE KITTNER 5P (MISCELLANEOUS) ×2 IMPLANT
STAPLER SKIN PROX 35W (STAPLE) ×2 IMPLANT
STRIP CLOSURE SKIN 1/4X4 (GAUZE/BANDAGES/DRESSINGS) IMPLANT
SUCTION FRAZIER HANDLE 10FR (MISCELLANEOUS) ×1
SUCTION TUBE FRAZIER 10FR DISP (MISCELLANEOUS) ×1 IMPLANT
SUT ETHILON 4-0 (SUTURE) ×1
SUT ETHILON 4-0 FS2 18XMFL BLK (SUTURE) ×1
SUT PROLENE 6 0 P 1 18 (SUTURE) ×2 IMPLANT
SUT SILK 2 0 (SUTURE) ×1
SUT SILK 2 0 SH (SUTURE) ×2 IMPLANT
SUT SILK 2-0 18XBRD TIE 12 (SUTURE) ×1 IMPLANT
SUT VIC AB 4-0 RB1 18 (SUTURE) ×2 IMPLANT
SUTURE ETHLN 4-0 FS2 18XMF BLK (SUTURE) ×1 IMPLANT
SYSTEM CHEST DRAIN TLS 7FR (DRAIN) IMPLANT

## 2019-03-15 NOTE — Progress Notes (Signed)
Call to Dr. Pryor Ochoa , [place]. Has meet discharge criteria . Okay to discharge home .

## 2019-03-15 NOTE — Anesthesia Preprocedure Evaluation (Signed)
Anesthesia Evaluation  Patient identified by MRN, date of birth, ID band Patient awake    Reviewed: Allergy & Precautions, H&P , NPO status , Patient's Chart, lab work & pertinent test results, reviewed documented beta blocker date and time   Airway Mallampati: II  TM Distance: >3 FB Neck ROM: full    Dental  (+) Teeth Intact   Pulmonary neg pulmonary ROS, Current Smoker and Patient abstained from smoking.,    Pulmonary exam normal        Cardiovascular Exercise Tolerance: Good negative cardio ROS Normal cardiovascular exam Rhythm:regular Rate:Normal     Neuro/Psych negative neurological ROS  negative psych ROS   GI/Hepatic negative GI ROS, Neg liver ROS,   Endo/Other  negative endocrine ROS  Renal/GU negative Renal ROS  negative genitourinary   Musculoskeletal   Abdominal   Peds  Hematology negative hematology ROS (+)   Anesthesia Other Findings Past Medical History: No date: Medical history non-contributory Past Surgical History: No date: KNEE SURGERY; Bilateral No date: SHOULDER SURGERY; Left BMI    Body Mass Index: 31.63 kg/m     Reproductive/Obstetrics negative OB ROS                             Anesthesia Physical Anesthesia Plan  ASA: II  Anesthesia Plan: General ETT   Post-op Pain Management:    Induction:   PONV Risk Score and Plan:   Airway Management Planned:   Additional Equipment:   Intra-op Plan:   Post-operative Plan:   Informed Consent: I have reviewed the patients History and Physical, chart, labs and discussed the procedure including the risks, benefits and alternatives for the proposed anesthesia with the patient or authorized representative who has indicated his/her understanding and acceptance.     Dental Advisory Given  Plan Discussed with: CRNA  Anesthesia Plan Comments:         Anesthesia Quick Evaluation

## 2019-03-15 NOTE — Op Note (Signed)
..  03/15/2019  8:51 AM    Laqueta Linden  [id number]   Pre-Op Dx:  Sialolithiasis of submandibular gland, left, recurrent sialoadenitis  Post-op Dx: same  Proc: Left Submandibular Gland Excision  Surg: Jeannie Fend Gimena Buick  Asst:  Anda Latina  Anes:  GOT  EBL: <73ml  Comp:  none  Findings:  Left marginal mandibular nerve identified and stimulated and preserved, left lingual nerve identified and preserved, moderate fibrosis of left submandibular gland with stone in superior aspect near duct.  Procedure:  After the patient was identified in hold and the history and physical and consent was reviewed and updated. The patient was marked in the normal fashion in an existing skin crease of his left neck just below the level of the submandibular gland approximately 2 finger lengths below the body of the mandible. The patient was next taken to the operating room and placed in a supine position. General endotracheal anesthesia was induced in the normal fashion. The patient's left neck was neck injected with 7cc's of 1% lidocaine with 1:100,000 Epinephrine. The patient was next prepped and draped in a sterile normal fashion.  At this time, a 15 blade scalpel was used to make a skin incision along the previously marked neck crease in the left neck after the proper time out was performed. Dissection was carefully performed through the subcutaneous tissues with combination of Bovie electrocautery and blunt dissection. The platysma muscle was divided with harmonic scalpel.  A nerve stimulator was used to ensure no injury to the marginal mandibular nerve was made.  The left marginal mandibular nerve was identified just beneath the platysma and was retracted superiorly with retractor for protection. Blunt dissection inferiorly along the inferior edge of the submandibular gland was performed.  The fascia overlying the gland was divided and retracted superiorly.  Continued blunt and sharp dissection  with Harmonic Scalpel was performed circumferentially around the gland.  The facial artery was noted posteriorly and the attachments to the gland were divided with combination of Harmonic scalpel and 2.0 suture tie.  The main trunk of the facial artery was preserved.  The hypoglossal nerve deep to the digastric triangle was preserved.  The Lingual nerve along the superior aspect of the gland and duct was identified.  This was moderately fibrotically attached to the Wharton's duct.  The submandibular ganglion was divided with Harmonic Scalpel with care taken to avoid injury to the Lingual nerve.  The remaining attachments of the gland deeply were next carefully transected with the Harmonic scalpel again with taking care to avoid injury to the hypoglossal and lingual nerves. At this time, with the gland pedicled on Wharton's duct,and the stone was identified in the gland, the duct was divided with Harmonic scalpel.  At this time, the wound was copiously irrigated with sterile saline. Meticulous hemostasis with bipolar was obtained. Surgicel was placed in the wound bed. The wound was then closed in a multilayered fashion with vicryl for subcutaneous tissues and Dermabond for the cutaneous skin closure.  At this time the patient was extubated and taken to PACU in good condition.    Dispo: PACU in good condition.  Plan:  Ice, elevation, analgesia.  Admit for observation.  Anticipate discharge later today.  Jeannie Fend Reynard Christoffersen 03/15/2019 8:51 AM

## 2019-03-15 NOTE — Anesthesia Post-op Follow-up Note (Signed)
Anesthesia QCDR form completed.        

## 2019-03-15 NOTE — Anesthesia Procedure Notes (Signed)
Procedure Name: Intubation Date/Time: 03/15/2019 7:27 AM Performed by: Jerrye Noble, CRNA Pre-anesthesia Checklist: Patient identified, Suction available, Emergency Drugs available, Patient being monitored and Timeout performed Patient Re-evaluated:Patient Re-evaluated prior to induction Oxygen Delivery Method: Circle system utilized Preoxygenation: Pre-oxygenation with 100% oxygen Induction Type: IV induction Ventilation: Two handed mask ventilation required and Oral airway inserted - appropriate to patient size Laryngoscope Size: McGraph and 4 Grade View: Grade I Tube type: Oral Tube size: 7.5 mm Number of attempts: 1 Airway Equipment and Method: Stylet Placement Confirmation: ETT inserted through vocal cords under direct vision,  positive ETCO2 and breath sounds checked- equal and bilateral Secured at: 22 cm Tube secured with: Tape Dental Injury: Teeth and Oropharynx as per pre-operative assessment

## 2019-03-15 NOTE — Discharge Instructions (Signed)

## 2019-03-15 NOTE — H&P (Signed)
..  History and Physical paper copy reviewed and updated date of procedure and will be scanned into system.  Patient seen and examined and marked.  

## 2019-03-15 NOTE — Transfer of Care (Signed)
Immediate Anesthesia Transfer of Care Note  Patient: Bethany Cumming Kretschmer  Procedure(s) Performed: EXCISION SUBMANDIBULAR GLAND (Left Neck)  Patient Location: PACU  Anesthesia Type:General  Level of Consciousness: drowsy and patient cooperative  Airway & Oxygen Therapy: Patient Spontanous Breathing and Patient connected to face mask oxygen  Post-op Assessment: Report given to RN and Post -op Vital signs reviewed and stable  Post vital signs: Reviewed and stable  Last Vitals:  Vitals Value Taken Time  BP    Temp    Pulse 87 03/15/19 0908  Resp 17 03/15/19 0908  SpO2 100 % 03/15/19 0908  Vitals shown include unvalidated device data.  Last Pain:  Vitals:   03/15/19 0611  TempSrc: Tympanic  PainSc: 0-No pain         Complications: No apparent anesthesia complications

## 2019-03-15 NOTE — Progress Notes (Signed)
Dr. Pryor Ochoa at bedside , pt. Must be able to tolerate po's , walk and void prior to discharge , Pt. To remain in post -op 4 hours, then okay to discharge if criteria meet.

## 2019-03-16 LAB — SURGICAL PATHOLOGY

## 2019-03-16 NOTE — Anesthesia Postprocedure Evaluation (Signed)
Anesthesia Post Note  Patient: Mike Hansen  Procedure(s) Performed: EXCISION SUBMANDIBULAR GLAND (Left Neck)  Patient location during evaluation: PACU Anesthesia Type: General Level of consciousness: awake and alert Pain management: pain level controlled Vital Signs Assessment: post-procedure vital signs reviewed and stable Respiratory status: spontaneous breathing, nonlabored ventilation, respiratory function stable and patient connected to nasal cannula oxygen Cardiovascular status: blood pressure returned to baseline and stable Postop Assessment: no apparent nausea or vomiting Anesthetic complications: no     Last Vitals:  Vitals:   03/15/19 1003 03/15/19 1219  BP: 138/73 (!) 142/90  Pulse: 82 92  Resp: 16 16  Temp: 36.7 C 36.7 C  SpO2: 98% 99%    Last Pain:  Vitals:   03/15/19 1219  TempSrc: Temporal  PainSc: 2                  Molli Barrows

## 2019-05-03 ENCOUNTER — Encounter: Payer: Self-pay | Admitting: Emergency Medicine

## 2019-05-03 ENCOUNTER — Ambulatory Visit
Admission: EM | Admit: 2019-05-03 | Discharge: 2019-05-03 | Disposition: A | Discharge status: Home / Self Care | Payer: BC Managed Care – PPO | Attending: Family Medicine | Admitting: Family Medicine

## 2019-05-03 ENCOUNTER — Other Ambulatory Visit: Payer: Self-pay

## 2019-05-03 DIAGNOSIS — J069 Acute upper respiratory infection, unspecified: Secondary | ICD-10-CM | POA: Diagnosis not present

## 2019-05-03 DIAGNOSIS — Z87891 Personal history of nicotine dependence: Secondary | ICD-10-CM | POA: Diagnosis not present

## 2019-05-03 MED ORDER — HYDROCOD POLST-CPM POLST ER 10-8 MG/5ML PO SUER: 5.0000 mL | Freq: Two times a day (BID) | ORAL | 0 refills | Status: DC | PRN

## 2019-05-03 NOTE — ED Provider Notes (Signed)
[facility]    CSN: [id number] Arrival date & time: 05/03/19  2505      History   Chief Complaint Chief Complaint  Patient presents with  . Cough  . Nasal Congestion  . Fever    HPI Mike Hansen is a 38 y.o. male.   38 yo male with a c/o cough and congestion for the past 6 days accompanied by fevers of 100-100.2. denies any pain, wheezing or shortness of breath.    Cough Associated symptoms: fever   Fever Associated symptoms: cough     Past Medical History:  Diagnosis Date  . Medical history non-contributory     Patient Active Problem List   Diagnosis Date Noted  . H/O submandibular gland removal 03/15/2019    Past Surgical History:  Procedure Laterality Date  . KNEE SURGERY Bilateral   . SHOULDER SURGERY Left   . SUBMANDIBULAR GLAND EXCISION Left 03/15/2019   Procedure: EXCISION SUBMANDIBULAR GLAND;  Surgeon: Bud Face, MD;  Location: [facility] ORS;  Service: ENT;  Laterality: Left;       Home Medications    Prior to Admission medications   Medication Sig Start Date End Date Taking? Authorizing Provider  acetaminophen-codeine (TYLENOL #3) 300-30 MG tablet Take 1-2 tablets by mouth every 4 (four) hours as needed for moderate pain. 03/15/19   Vaught, Roney Mans, MD  chlorpheniramine-HYDROcodone (TUSSIONEX PENNKINETIC ER) 10-8 MG/5ML SUER Take 5 mLs by mouth every 12 (twelve) hours as needed. 05/03/19   Payton Mccallum, MD  ondansetron (ZOFRAN) 4 MG tablet Take 1 tablet (4 mg total) by mouth every 8 (eight) hours as needed for up to 10 doses for nausea or vomiting. 03/15/19   Bud Face, MD    Family History Family History  Problem Relation Age of Onset  . Hypertension Father   . Heart attack Father   . Cancer Father     Social History Social History   Tobacco Use  . Smoking status: Former Smoker    Packs/day: 1.00    Types: Cigarettes  . Smokeless tobacco: Never Used  Substance Use Topics  . Alcohol use: Yes    Comment:  SOCIAL  . Drug use: Never     Allergies   Patient has no known allergies.   Review of Systems Review of Systems  Constitutional: Positive for fever.  Respiratory: Positive for cough.      Physical Exam Triage Vital Signs ED Triage Vitals  Enc Vitals Group     BP 05/03/19 0833 (!) 152/100     Pulse Rate 05/03/19 0833 64     Resp 05/03/19 0833 18     Temp 05/03/19 0833 98.6 F (37 C)     Temp Source 05/03/19 0833 Oral     SpO2 05/03/19 0833 97 %     Weight 05/03/19 0831 240 lb (108.9 kg)     Height 05/03/19 0831 5\' 10"  (1.778 m)     Head Circumference --      Peak Flow --      Pain Score 05/03/19 0831 0     Pain Loc --      Pain Edu? --      Excl. in GC? --    No data found.  Updated Vital Signs BP (!) 152/100 (BP Location: Right Arm)   Pulse 64   Temp 98.6 F (37 C) (Oral)   Resp 18   Ht 5\' 10"  (1.778 m)   Wt 108.9 kg   SpO2 97%   BMI  34.44 kg/m   Visual Acuity Right Eye Distance:   Left Eye Distance:   Bilateral Distance:    Right Eye Near:   Left Eye Near:    Bilateral Near:     Physical Exam Vitals signs and nursing note reviewed.  Constitutional:      General: He is not in acute distress.    Appearance: He is not toxic-appearing or diaphoretic.  Neck:     Musculoskeletal: Neck supple.  Cardiovascular:     Rate and Rhythm: Normal rate and regular rhythm.  Pulmonary:     Effort: Pulmonary effort is normal. No respiratory distress.     Breath sounds: Normal breath sounds. No stridor. No wheezing, rhonchi or rales.  Neurological:     Mental Status: He is alert.      UC Treatments / Results  Labs (all labs ordered are listed, but only abnormal results are displayed) Labs Reviewed  NOVEL CORONAVIRUS, NAA (HOSP ORDER, SEND-OUT TO REF LAB; TAT 18-24 HRS)    EKG   Radiology No results found.  Procedures Procedures (including critical care time)  Medications Ordered in UC Medications - No data to display  Initial Impression /  Assessment and Plan / UC Course  I have reviewed the triage vital signs and the nursing notes.  Pertinent labs & imaging results that were available during my care of the patient were reviewed by me and considered in my medical decision making (see chart for details).      Final Clinical Impressions(s) / UC Diagnoses   Final diagnoses:  Viral URI with cough    ED Prescriptions    Medication Sig Dispense Auth. Provider   chlorpheniramine-HYDROcodone (TUSSIONEX PENNKINETIC ER) 10-8 MG/5ML SUER Take 5 mLs by mouth every 12 (twelve) hours as needed. 69 mL Norval Gable, MD     1. diagnosis reviewed with patient 2. rx as per orders above; reviewed possible side effects, interactions, risks and benefits  3. Recommend supportive treatment with rest, fluids, otc medications 4. Follow-up prn if symptoms worsen or don't improve  I have reviewed the PDMP during this encounter.   Norval Gable, MD 05/03/19 [phone]

## 2019-05-03 NOTE — ED Triage Notes (Signed)
Patient c/o cough and congestion that started on Tuesday. He states he started with a low grade fever on Friday of 100.2 and 99.8 Saturday.

## 2019-05-04 LAB — NOVEL CORONAVIRUS, NAA (HOSP ORDER, SEND-OUT TO REF LAB; TAT 18-24 HRS): SARS-CoV-2, NAA: NOT DETECTED

## 2019-11-11 ENCOUNTER — Ambulatory Visit
Admission: EM | Admit: 2019-11-11 | Discharge: 2019-11-11 | Disposition: A | Discharge status: Home / Self Care | Payer: BC Managed Care – PPO | Attending: Emergency Medicine | Admitting: Emergency Medicine

## 2019-11-11 ENCOUNTER — Other Ambulatory Visit: Payer: Self-pay

## 2019-11-11 ENCOUNTER — Ambulatory Visit (INDEPENDENT_AMBULATORY_CARE_PROVIDER_SITE_OTHER): Payer: BC Managed Care – PPO

## 2019-11-11 ENCOUNTER — Encounter: Payer: Self-pay | Admitting: Emergency Medicine

## 2019-11-11 DIAGNOSIS — S93411A Sprain of calcaneofibular ligament of right ankle, initial encounter: Secondary | ICD-10-CM | POA: Diagnosis not present

## 2019-11-11 DIAGNOSIS — R030 Elevated blood-pressure reading, without diagnosis of hypertension: Secondary | ICD-10-CM

## 2019-11-11 MED ORDER — IBUPROFEN 600 MG PO TABS: 600.0000 mg | ORAL_TABLET | Freq: Four times a day (QID) | ORAL | 0 refills | Status: AC | PRN

## 2019-11-11 NOTE — Discharge Instructions (Signed)
Your x-ray was negative for any acute fractures.  You have sprained your ankle, however sometimes this takes as long as a fracture to completely heal.  Use the crutches as needed for comfort.  Wear the ASO for the next several weeks at least.  Take 600 mg of ibuprofen combined with 1000 mg of Tylenol 3-4 times a day as needed for pain.  Do not take this for more than for 5 days.  Decrease your salt intake. diet and exercise will lower your blood pressure significantly. It is important to keep your blood pressure under good control, as having a elevated blood pressure for prolonged periods of time significantly increases your risk of stroke, heart attacks, kidney damage, eye damage, and other problems. Measure your blood pressure once a day, preferably at the same time every day. Keep a log of this and bring it to your next doctor's appointment.  Bring your blood pressure cuff as well.  Return here in 2 weeks for blood pressure recheck if you're unable to find a primary care physician by then. Return immediately to the ER if you start having chest pain, headache, problems seeing, problems talking, problems walking, if you feel like you're about to pass out, if you do pass out, if you have a seizure, or for any other concerns.  Here is a list of primary care providers who are taking new patients:  Dr. Elizabeth Sauer [address] [place] [address] [phone]  [facility] [address] [place] [address]  [phone]  [facility] [address] [place], [place] [address] [phone]  [facility] [address] [place]  [phone] [place], [place] [address]  Here are clinics/ other resources who will see you if you do not have insurance. Some have certain criteria that you must meet. Call them and find out what they are:  [facility]: [address], [place], [place] [address] Phone: [phone] Hours: First and Third Saturdays of each Month, 9  a.m. - 1 p.m.  [facility]: [address], [address] [place], [place] [address] Phone: [phone] Hours: Tuesday, 4 p.m. - 8 p.m. Thursday, 1 p.m. - 8 p.m. Wednesday, 9 a.m. - [facility] [address] [place], [place] [address] Phone: [phone] Pharmacy Phone Number: [phone] Dental Phone Number: [phone] [facility] Insurance Help: [phone]  Dental Hours: Monday - Thursday, 8 a.m. - 6 p.m.  [facility] [address], [place], [place] [address] Phone: [phone] Pharmacy Phone Number: [phone] [facility] Insurance Help: [phone]  [facility] [address] [place], [place] [address] Phone: [phone] Pharmacy Phone Number: [phone] [facility] Insurance Help: [phone]  [facility] [address] [place], [place] [address] Phone: [phone] [facility] Insurance Help: [phone]   [facility] [address] [place], [place] [address] Phone: [phone]  Go to www.goodrx.com to look up your medications. This will give you a list of where you can find your prescriptions at the most affordable prices. Or ask the pharmacist what the cash price is, or if they have any other discount programs available to help make your medication more affordable. This can be less expensive than what you would pay with insurance.

## 2019-11-11 NOTE — ED Provider Notes (Addendum)
HPI  SUBJECTIVE:  Mike Hansen is a 39 y.o. male who presents with lateral right ankle pain after inverting his ankle while going down some steps today.  He reports immediate throbbing pain that is constant, getting worse.  He denies feeling a pop.  He was wearing cowboy boots and compression hose at the time.  He was able to bear weight on it immediately afterwards.  No known swelling, bruising but has not yet taken off his boot.  No distal numbness or tingling.  No injury to the foot.  He tried Tylenol 500 mg without improvement in his symptoms, symptoms are worse with inverting/everting ankle and weightbearing.  He has a past medical history of multiple right ankle sprains and had a right ankle fracture as a child which was treated with casting.  No history of liver, kidney disease, diabetes, hypertension.  PMD: None.  Past Medical History:  Diagnosis Date  . Medical history non-contributory     Past Surgical History:  Procedure Laterality Date  . KNEE SURGERY Bilateral   . SHOULDER SURGERY Left   . SUBMANDIBULAR GLAND EXCISION Left 03/15/2019   Procedure: EXCISION SUBMANDIBULAR GLAND;  Surgeon: Carloyn Manner, MD;  Location: [facility] ORS;  Service: ENT;  Laterality: Left;    Family History  Problem Relation Age of Onset  . Hypertension Mother   . Thyroid disease Mother   . Hypertension Father   . Heart attack Father   . Cancer Father     Social History   Tobacco Use  . Smoking status: Former Smoker    Packs/day: 1.00    Types: Cigarettes    Quit date: 07/13/2019    Years since quitting: 0.3  . Smokeless tobacco: Never Used  Substance Use Topics  . Alcohol use: Yes    Comment: social  . Drug use: Never    No current facility-administered medications for this encounter.  Current Outpatient Medications:  .  ibuprofen (ADVIL) 600 MG tablet, Take 1 tablet (600 mg total) by mouth every 6 (six) hours as needed., Disp: 30 tablet, Rfl: 0  No Known Allergies   ROS  As  noted in HPI.   Physical Exam  BP (!) 159/99 (BP Location: Left Arm)   Pulse 66   Temp 98.1 F (36.7 C) (Oral)   Resp 18   Ht 5' 10.5" (1.791 m)   Wt 113.4 kg   SpO2 99%   BMI 35.36 kg/m   Constitutional: Well developed, well nourished, no acute distress Eyes:  EOMI, conjunctiva normal bilaterally HENT: Normocephalic, atraumatic,mucus membranes moist Respiratory: Normal inspiratory effort Cardiovascular: Normal rate GI: nondistended skin: No rash, skin intact Musculoskeletal:R  Ankle lateral soft tissue swelling.  Proximal fibula NT , Distal fibula NT , Medial malleolus NT ,  Deltoid ligament medially NT, ATFL  NT , calcaneofibular ligament  tender, posterior tablofibular ligament NT ,  Achilles NT, calcaneus NT,  Proximal 5th metatarsal NT, Midfoot NT, distal NVI with baseline sensation / motor to foot with DP 2+ pain with dorsiflexion, inversion.  No pain with plantar flexion, eversion. - bruising.  - squeeze test .  Ant drawer test stable. Pt not able to bear weight in dept.  Neurologic: Alert & oriented x 3, no focal neuro deficits Psychiatric: Speech and behavior appropriate   ED Course   Medications - No data to display  Orders Placed This Encounter  Procedures  . DG Ankle Complete Right    Standing Status:   Standing    Number of  Occurrences:   1    Order Specific Question:   Reason for Exam (SYMPTOM  OR DIAGNOSIS REQUIRED)    Answer:   pain after twisting ankle today  . Apply ASO ankle    Standing Status:   Standing    Number of Occurrences:   1    Order Specific Question:   Laterality    Answer:   Right  . Crutches    Standing Status:   Standing    Number of Occurrences:   1    No results found for this or any previous visit (from the past 24 hour(s)). DG Ankle Complete Right  Result Date: 11/11/2019 CLINICAL DATA:  Twisting right ankle injury today with pain lateral. EXAM: RIGHT ANKLE - COMPLETE 3+ VIEW COMPARISON:  None. FINDINGS: Ankle mortise is  normal. Tiny well corticated bony fragment distal to the tip of the fibula compatible chronic finding. No acute fracture or dislocation. Mild degenerate change over the midfoot. IMPRESSION: No acute findings. Electronically Signed   By: Elberta Fortis M.D.   On: 11/11/2019 13:58    ED Clinical Impression  1. Sprain of calcaneofibular ligament of right ankle, initial encounter   2. Elevated blood pressure reading      ED Assessment/Plan  Patient unable to bear weight in department.  Will check x-ray.    Reviewed imaging independently.  No acute fracture.  See radiology report for full details.  will treat as an ankle sprain with crutches, ASO, ice, elevation, ibuprofen 600 mg combined with 1000 milligrams of Tylenol 3-4 times a day as needed for pain.  Follow-up with orthopedics in 10 days to 2 weeks if not better for physical therapy and reevaluation.  Work note for Saturday.  He is off Sunday Monday.  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  Go back to work Tuesday if feeling well.  2.  Elevated blood pressure.  No formal diagnosis of hypertension, however discussed with patient the dangers of uncontrolled longstanding hypertension.  He will buy blood pressure cuff, measure his blood pressure once a day, keep a log of this.  Will provide him a primary care list for him to follow-up with, especially if it remains persistently elevated.  May return here in 2 weeks, bring his log if unable to find a primary care physician by then repeat evaluation and possible initiation of medication.  Discussed lifestyle changes.   Discussed  imaging, MDM, treatment plan, and plan for follow-up with patient. patient agrees with plan.   Meds ordered this encounter  Medications  . ibuprofen (ADVIL) 600 MG tablet    Sig: Take 1 tablet (600 mg total) by mouth every 6 (six) hours as needed.    Dispense:  30 tablet    Refill:  0    *This clinic note was  created using Scientist, clinical (histocompatibility and immunogenetics). Therefore, there may be occasional mistakes despite careful proofreading.   ?    Domenick Gong, MD 11/11/19 1617    Domenick Gong, MD 11/11/19 [id number]

## 2019-11-11 NOTE — ED Triage Notes (Signed)
Patient in today c/o right ankle pain after twisting his ankle earlier today. Patient has taken OTC Tylenol.

## 2020-10-19 IMAGING — CT CT NECK WITH CONTRAST
5 series · 16 of 33 positions shown, 18 images · IV contrast (omnipaque)
Comparison: None.

CLINICAL DATA: Sialolithiasis.  Left neck swelling

EXAM:
CT NECK WITH CONTRAST
TECHNIQUE: Multidetector CT imaging of the neck was performed using the
standard protocol following the bolus administration of intravenous
contrast.
CONTRAST:  75mL OMNIPAQUE IOHEXOL 300 MG/ML  SOLN

[Series 2: axial neck · axial · 0.55mm/px · z∈[-677,-537]mm · 3 of 141 slices shown]
[im 36/141  bone]
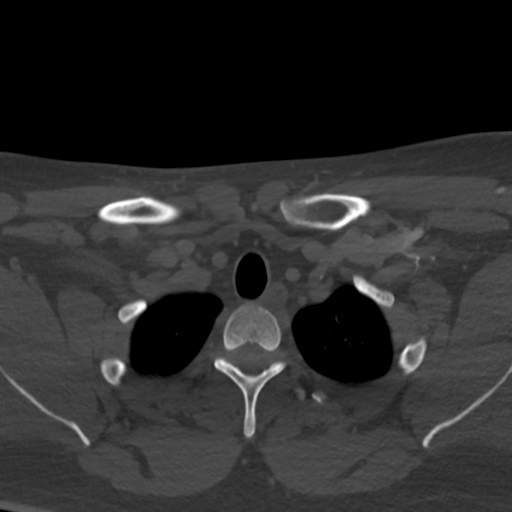
[im 71/141  bone]
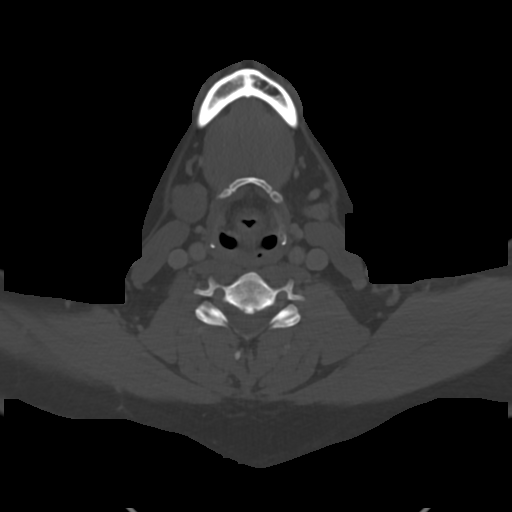
[im 106/141  bone]
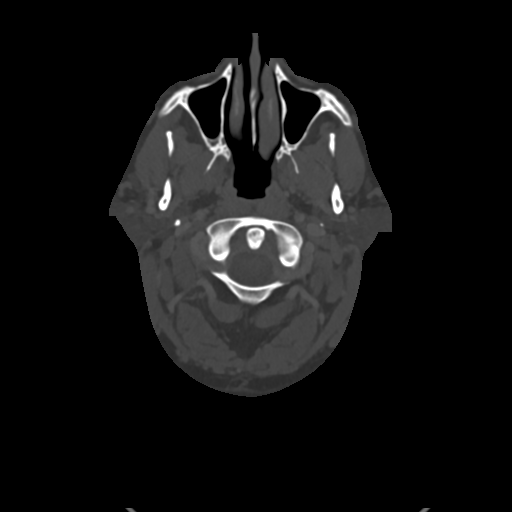

[Series 3: axial bone neck · axial · 0.55mm/px · z∈[-655,-561]mm · 2 of 141 slices shown]
[im 47/141  bone]
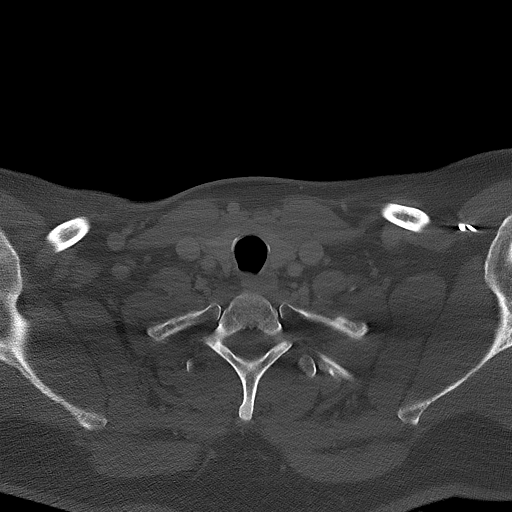
[im 94/141  bone]
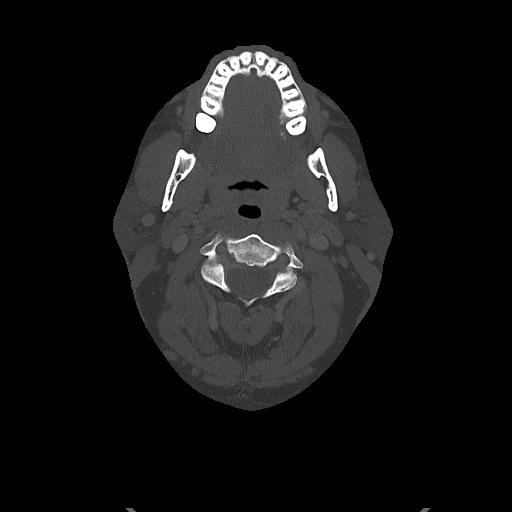

[Series 4: coronal neck · coronal · 0.54mm/px · 3 of 147 slices shown]
[im 41/147  bone]
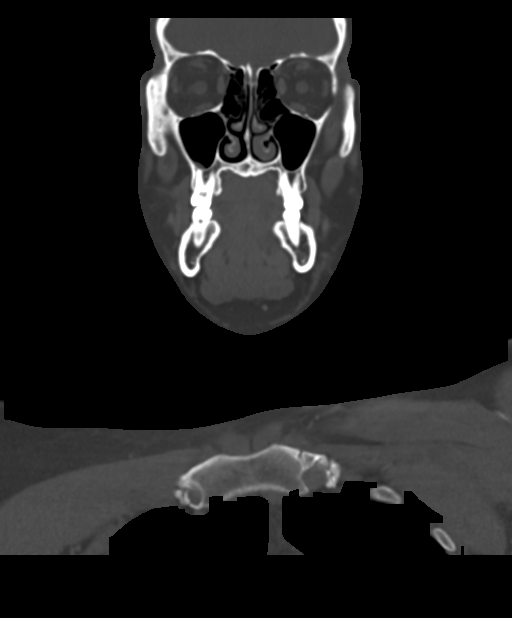
[im 63/147  bone]
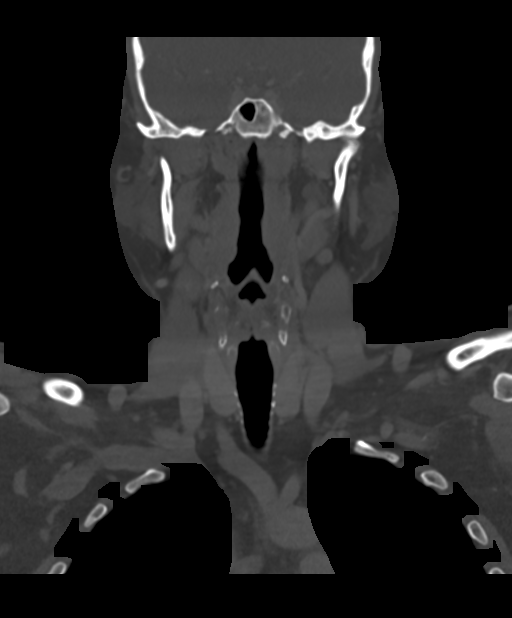
[im 84/147  bone]
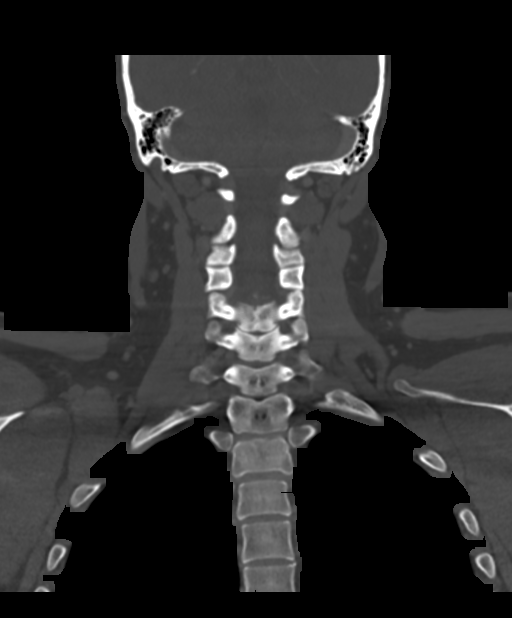

[Series 6: sagittal neck · sagittal · 0.57mm/px · 5 of 139 slices shown, 6 images]
[im 47/139  bone]
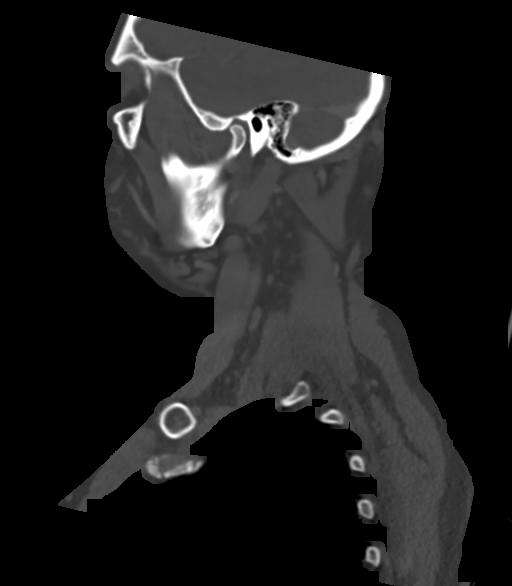
[im 58/139  bone]
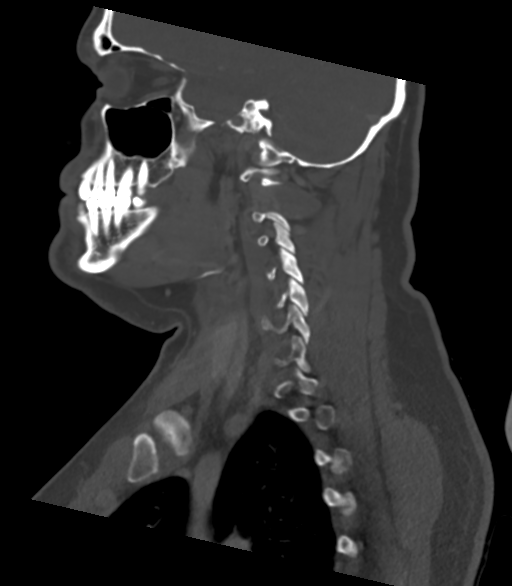
[im 70/139  soft-tissue]
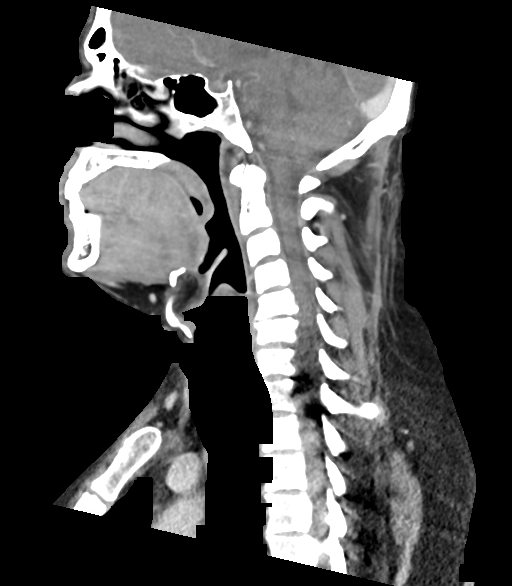
[im 70/139  bone]
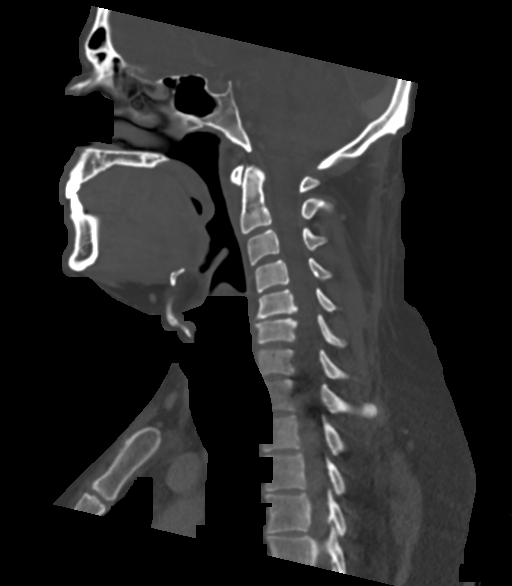
[im 81/139  bone]
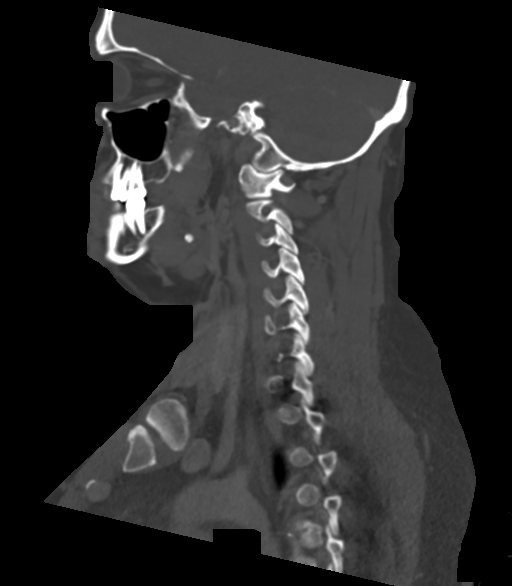
[im 93/139  bone]
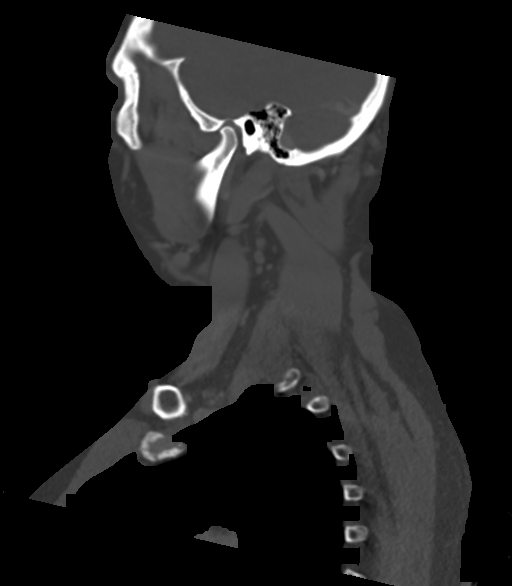

[Series 8: ax oropharynx neck · axial · 0.54mm/px · z∈[-720,-557]mm · 3 of 168 slices shown, 4 images]
[im 42/168  soft-tissue]
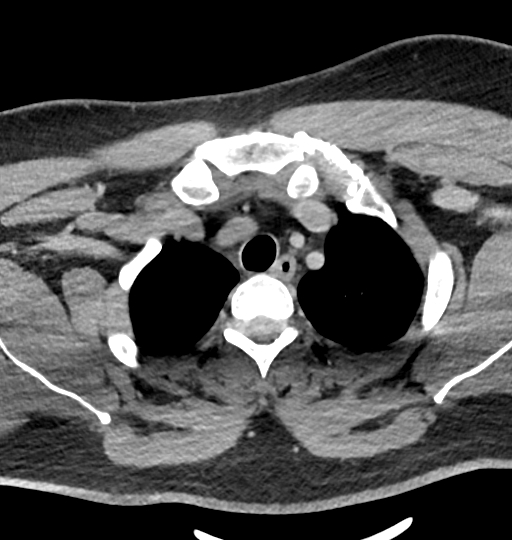
[im 42/168  bone]
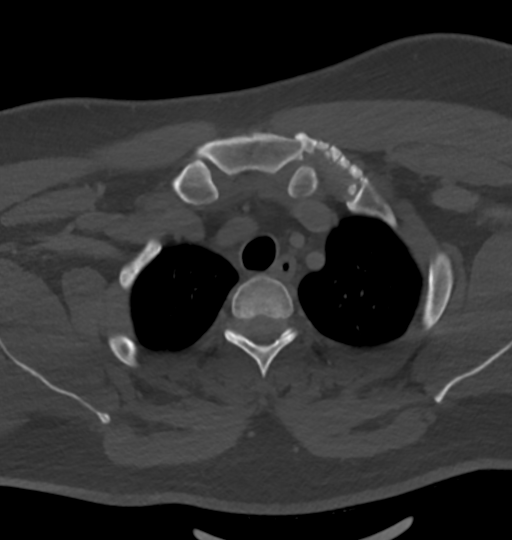
[im 84/168  bone]
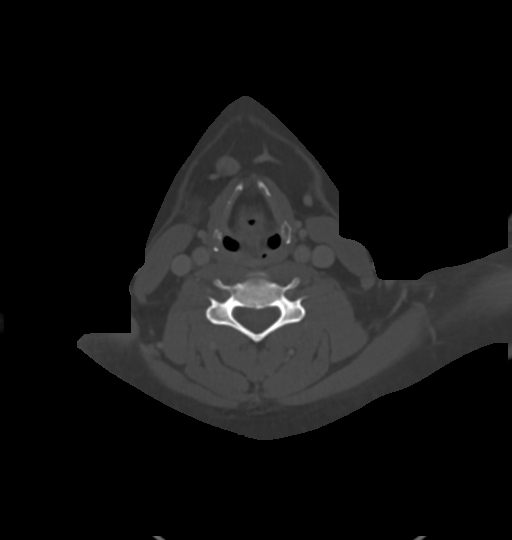
[im 126/168  bone]
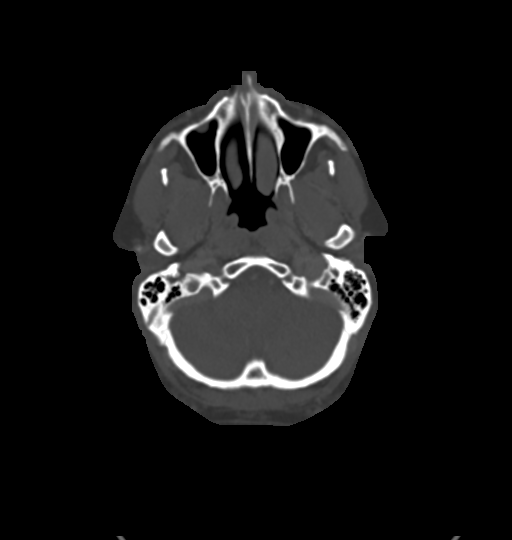

[16 of 33 positions shown; findings below may reference images not displayed]

FINDINGS: Pharynx and larynx: Normal. No mass or swelling.

Salivary glands: 5 mm calculus in the left superior submandibular
gland. No ductal obstruction. No swelling or enlargement of the
submandibular gland. No other calculi.

Right submandibular gland normal.  Parotid gland normal bilaterally.

Thyroid: Negative

Lymph nodes: 13 mm short axis diameter level 2 lymph node on the
left. Subcentimeter adjacent lymph nodes on the left. Right level 2
lymph node 10 mm.

Vascular: Normal vascular enhancement

Limited intracranial: Negative

Visualized orbits: Negative

Mastoids and visualized paranasal sinuses: Negative

Skeleton: Disc degeneration and spondylosis C5-6. No acute skeletal
abnormality.

Upper chest: Lung apices clear bilaterally.

Other: None
IMPRESSION: 5 mm nonobstructing stone left superior submandibular gland. No
edema in the submandibular gland. No ductal obstruction.

Negative for mass lesion. Left level 2 lymph node 13 mm, upper
normal. Probable reactive node.

## 2021-07-26 IMAGING — CR DG ANKLE COMPLETE 3+V*R*
3 series · 3 of 3 positions shown · non-contrast
Comparison: None.

CLINICAL DATA: Twisting right ankle injury today with pain lateral.

EXAM:
RIGHT ANKLE - COMPLETE 3+ VIEW

[ankle ap]
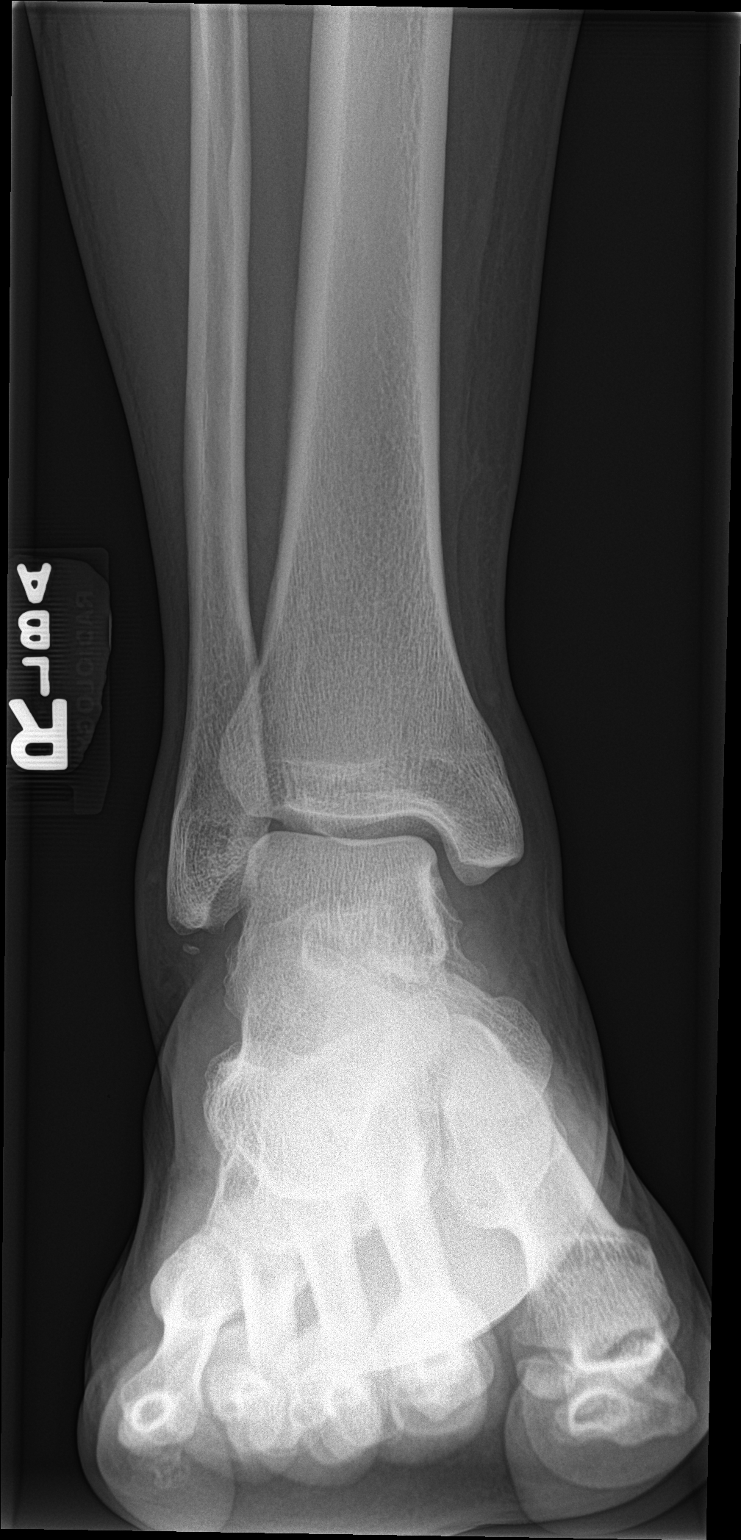

[ankle obl]
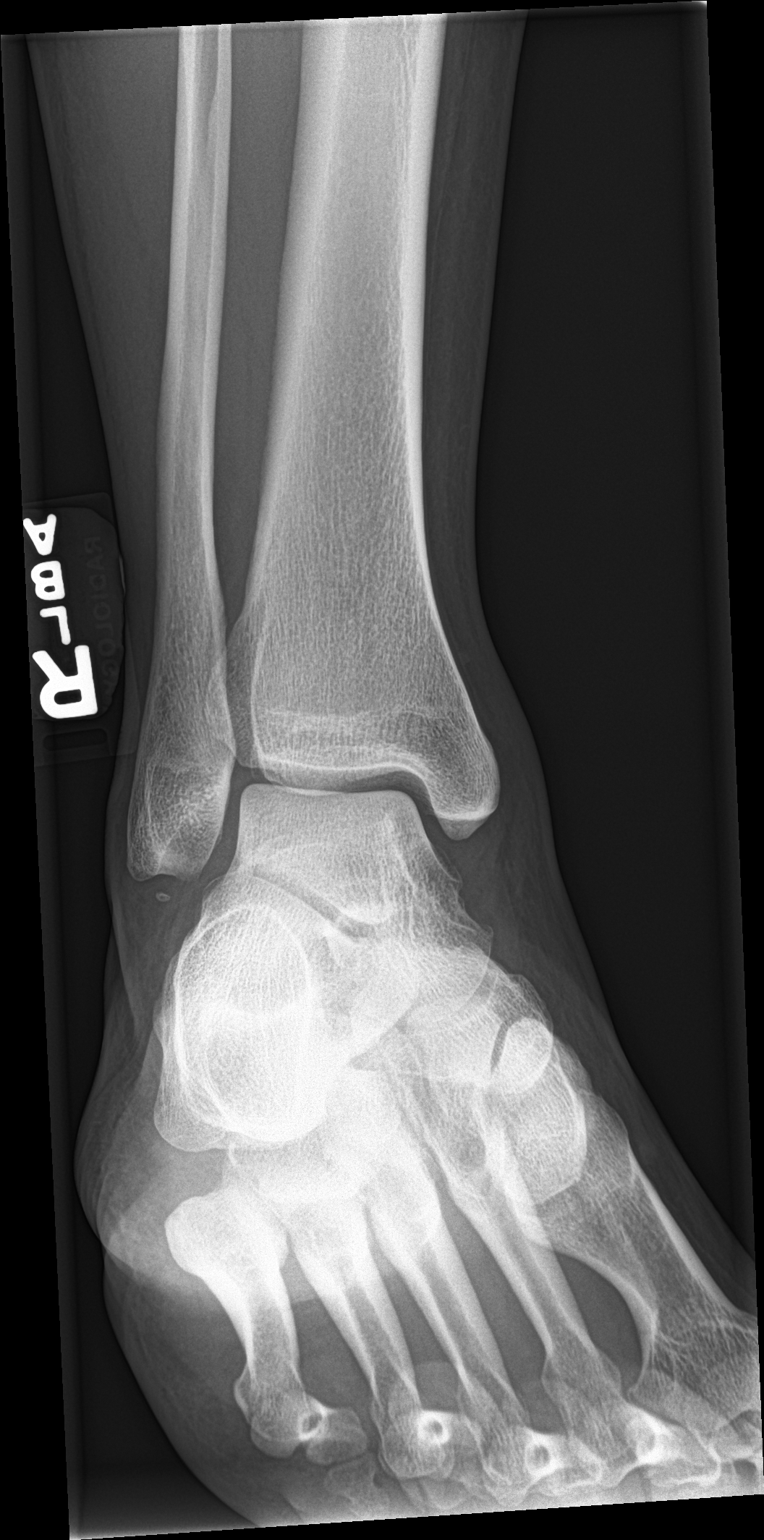

[ankle lat]
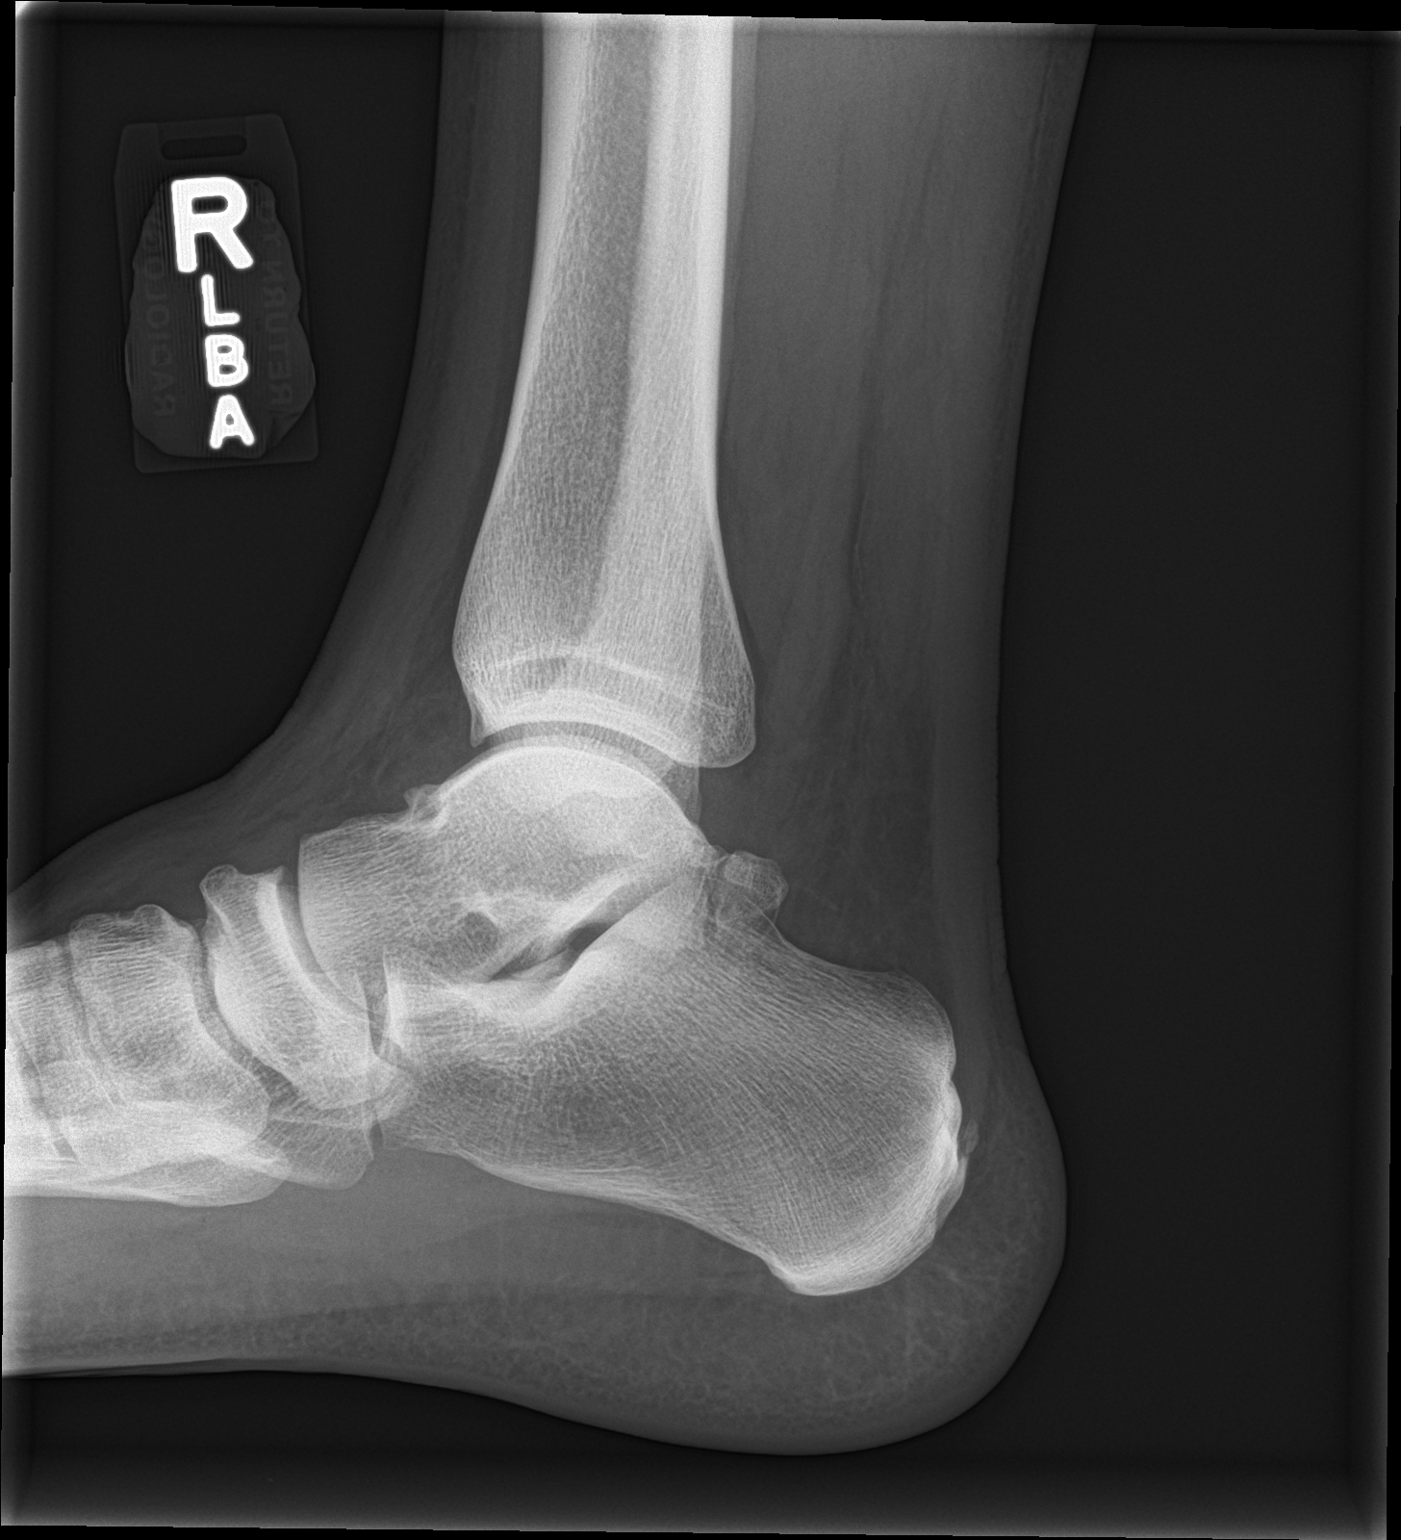

[3 of 3 positions shown; findings below may reference images not displayed]

FINDINGS: Ankle mortise is normal. Tiny well corticated bony fragment distal
to the tip of the fibula compatible chronic finding. No acute
fracture or dislocation. Mild degenerate change over the midfoot.
IMPRESSION: No acute findings.
